# Patient Record
Sex: Male | Born: 1955 | Race: Black or African American | Hispanic: No | Marital: Single | Smoking: Never smoker
Health system: Southern US, Community
[De-identification: ages and names within clinical notes are randomized; demographics above are authoritative.]

## PROBLEM LIST (undated history)

## (undated) DIAGNOSIS — E119 Type 2 diabetes mellitus without complications: Secondary | ICD-10-CM

## (undated) DIAGNOSIS — I10 Essential (primary) hypertension: Secondary | ICD-10-CM

## (undated) DIAGNOSIS — N183 Chronic kidney disease, stage 3 unspecified: Secondary | ICD-10-CM

## (undated) DIAGNOSIS — E785 Hyperlipidemia, unspecified: Secondary | ICD-10-CM

## (undated) DIAGNOSIS — J189 Pneumonia, unspecified organism: Secondary | ICD-10-CM

## (undated) DIAGNOSIS — E669 Obesity, unspecified: Secondary | ICD-10-CM

## (undated) DIAGNOSIS — I509 Heart failure, unspecified: Secondary | ICD-10-CM

## (undated) HISTORY — PX: REPAIR OF LEFT VENTRICLE LACERATION: SHX6555

---

## 2004-12-16 ENCOUNTER — Ambulatory Visit: Payer: Self-pay | Admitting: Cardiovascular Disease

## 2005-09-22 ENCOUNTER — Ambulatory Visit: Payer: Self-pay | Admitting: Surgery

## 2010-11-11 ENCOUNTER — Ambulatory Visit: Payer: Self-pay | Admitting: Emergency Medicine

## 2010-11-14 LAB — PATHOLOGY REPORT

## 2013-11-19 ENCOUNTER — Ambulatory Visit: Payer: Self-pay | Admitting: Urology

## 2013-11-19 LAB — BASIC METABOLIC PANEL
Anion Gap: 6 — ABNORMAL LOW (ref 7–16)
BUN: 20 mg/dL — ABNORMAL HIGH (ref 7–18)
CALCIUM: 9.8 mg/dL (ref 8.5–10.1)
CHLORIDE: 99 mmol/L (ref 98–107)
CREATININE: 1.5 mg/dL — AB (ref 0.60–1.30)
Co2: 30 mmol/L (ref 21–32)
EGFR (African American): >60
EGFR (Non-African Amer.): 51 — ABNORMAL LOW
GLUCOSE: 211 mg/dL — AB (ref 65–99)
OSMOLALITY: 279 (ref 275–301)
POTASSIUM: 4.7 mmol/L (ref 3.5–5.1)
SODIUM: 135 mmol/L — AB (ref 136–145)

## 2013-11-24 ENCOUNTER — Ambulatory Visit: Payer: Self-pay | Admitting: Urology

## 2014-04-25 NOTE — Op Note (Signed)
PATIENT NAME:  [name], [name] MR#:  [id number] DATE OF BIRTH:  1955/01/23  DATE OF PROCEDURE:  11/24/2013  PREOPERATIVE DIAGNOSIS: Phimosis, penile condylomata.  POSTOPERATIVE DIAGNOSIS: Phimosis, penile condylomata   PROCEDURE PERFORMED: Circumcision.   ANESTHESIA: General anesthesia with mask LMA airway.   ATTENDING SURGEON: Claris GladdenAshley J. Ogle Hoeffner, MD.   ESTIMATED BLOOD LOSS: Minimal.  DRAINS: None.   COMPLICATIONS: None.   SPECIMENS: Foreskin.   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 He is now unable to retract his foreskin, and has fungating penile warts. He would like to undergo circumcision. For this purpose, he was counseled on his various treatment options, and has elected to undergo circumcision in the Operating Room as well as possible penile biopsy once the gland was exposed if any other lesions were identified. The risks and benefits of the procedure were explained in detail. The patient agreed to proceed as planned.   PROCEDURE IN DETAIL: The patient was correctly identified in the preoperative holding area and informed consent was confirmed. He was brought to the operating suite and placed on the table in the supine position. At this time, a universal timeout protocol was performed. All team members were identified. Venodyne boots were placed and he was administered 2 g of IV Ancef in the perioperative period. He was then placed under general anesthesia, repositioned and prepped and draped in standard surgical fashion.   At this point in time, local anesthesia was administered using 1% lidocaine 10 mL as a dorsal penile block, as well as 10 mL as a ring block around the base of the penile shaft. The phallus was examined, which revealed a large conglomeration of distal penile warts on the edge of the foreskin. The foreskin could not be retracted at this point in time. A clamp  was then used to crush the dorsal skin, and heavy scissors were used to create a dorsal slit in order to further retract the foreskin. Once this was performed, the glans was exposed. It was carefully inspected, and there was no evidence of glandular lesions. The foreskin was very mobile, and there was no concern for invasive malignancy related to the genital warts. Additional Betadine solution was used to clean the glans at this point. Two rings were drawn, 1 proximally 1 cm proximal to the coronal margin and 1 in the mid shaft area. A knife was used to incise these rings. The foreskin was then removed in a sleeve-like fashion. Care was taken to achieve very meticulous hemostasis. At this point, the penile shaft skin was then reapproximated using a series of interrupted 3-0 chromic sutures with a U-stitch at the frenulum. This created a nice cosmetic effect. He was then cleaned and dried, and a dressing of Vaseline gauze Conform and a Coban was applied.  The patient was then reversed from anesthesia and taken to the PACU in stable condition. There were no complications in this case.    ____________________________ Claris GladdenAshley J. Elija Mccamish, MD ajb:MT D: 11/24/2013 14:20:00 ET T: 11/24/2013 16:03:15 ET JOB#: [id number]  cc: Claris GladdenAshley J. Erland Vivas, MD, <Dictator> Claris GladdenASHLEY J Patricie Geeslin MD ELECTRONICALLY SIGNED 12/17/2013 10:49

## 2014-04-27 LAB — SURGICAL PATHOLOGY

## 2014-08-27 ENCOUNTER — Encounter: Payer: Self-pay | Admitting: Emergency Medicine

## 2014-08-27 ENCOUNTER — Emergency Department
Admission: EM | Admit: 2014-08-27 | Discharge: 2014-08-27 | Disposition: A | Discharge status: Home / Self Care | Payer: BLUE CROSS/BLUE SHIELD | Attending: Emergency Medicine | Admitting: Emergency Medicine

## 2014-08-27 DIAGNOSIS — S81811A Laceration without foreign body, right lower leg, initial encounter: Secondary | ICD-10-CM | POA: Diagnosis present

## 2014-08-27 DIAGNOSIS — W1843XA Slipping, tripping and stumbling without falling due to stepping from one level to another, initial encounter: Secondary | ICD-10-CM | POA: Diagnosis not present

## 2014-08-27 DIAGNOSIS — Y9389 Activity, other specified: Secondary | ICD-10-CM | POA: Diagnosis not present

## 2014-08-27 DIAGNOSIS — E119 Type 2 diabetes mellitus without complications: Secondary | ICD-10-CM | POA: Insufficient documentation

## 2014-08-27 DIAGNOSIS — Y9289 Other specified places as the place of occurrence of the external cause: Secondary | ICD-10-CM | POA: Insufficient documentation

## 2014-08-27 DIAGNOSIS — Y998 Other external cause status: Secondary | ICD-10-CM | POA: Diagnosis not present

## 2014-08-27 HISTORY — DX: Type 2 diabetes mellitus without complications: E11.9

## 2014-08-27 MED ORDER — LIDOCAINE-EPINEPHRINE (PF) 1 %-1:200000 IJ SOLN
5.0000 mL | Freq: Once | INTRAMUSCULAR | Status: AC
  Administered 2014-08-27: 5 mL
  Filled 2014-08-27: qty 30

## 2014-08-27 MED ORDER — HYDROCODONE-ACETAMINOPHEN 5-325 MG PO TABS: 1.0000 | ORAL_TABLET | ORAL | Status: DC | PRN

## 2014-08-27 NOTE — ED Notes (Signed)
Patient ambulatory to triage with steady gait, without difficulty or distress noted; pt reports cut right lower leg this morning while jumping on a box during training session

## 2014-08-27 NOTE — ED Provider Notes (Signed)
[facility] Emergency Department Provider Note  ____________________________________________  Time seen: Approximately 7:07 AM  I have reviewed the triage vital signs and the nursing notes.   HISTORY  Chief Complaint Laceration   HPI [name] is a 59 y.o. male is here with laceration to his right lower leg. He states that he was doing a training cross fit class this morning. He was jumping up on a box, missed and came down on his leg. He states he did not hit it hard enough to have fractured a bone but "it did roll the skin".He is up-to-date on his immunizations. He is diabetic and generally has good control of his glucose levels. He denies any head injury or loss of consciousness. 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 Currently he rates his pain 4 out of 10. Patient was ambulatory to the exam room without difficulty.   Past Medical History  Diagnosis Date  . Diabetes mellitus without complication     There are no active problems to display for this patient.   Past Surgical History  Procedure Laterality Date  . Repair of left ventricle laceration      Current Outpatient Rx  Name  Route  Sig  Dispense  Refill  . HYDROcodone-acetaminophen (NORCO/VICODIN) 5-325 MG per tablet   Oral   Take 1 tablet by mouth every 4 (four) hours as needed for moderate pain.   20 tablet   0     Allergies Review of patient's allergies indicates no known allergies.  No family history on file.  Social History Social History  Substance Use Topics  . Smoking status: Not on file  . Smokeless tobacco: Not on file  . Alcohol Use: Not on file    Review of Systems Constitutional: No fever/chills Cardiovascular: Denies chest pain. Respiratory: Denies shortness of breath. Gastrointestinal: No abdominal pain.  No nausea, no vomiting.  Musculoskeletal: Negative for back pain. Skin: Negative for rash. Neurological: Negative for  headaches, focal weakness or numbness.  10-point ROS otherwise negative.  ____________________________________________   PHYSICAL EXAM:  VITAL SIGNS: ED Triage Vitals  Enc Vitals Group     BP 08/27/14 0648 133/65 mmHg     Pulse Rate 08/27/14 0648 56     Resp 08/27/14 0648 18     Temp 08/27/14 0648 97.6 F (36.4 C)     Temp Source 08/27/14 0648 Oral     SpO2 08/27/14 0648 95 %     Weight 08/27/14 0648 255 lb (115.667 kg)     Height 08/27/14 0648  (1.702 m)     Head Cir --      Peak Flow --      Pain Score 08/27/14 0648 4     Pain Loc --      Pain Edu? --      Excl. in GC? --     Constitutional: Alert and oriented. Well appearing and in no acute distress. Eyes: Conjunctivae are normal. PERRL. EOMI. Head: Atraumatic. Nose: No congestion/rhinnorhea. Neck: No stridor.   Cardiovascular: Normal rate, regular rhythm. Grossly normal heart sounds.  Good peripheral circulation. Respiratory: Normal respiratory effort.  No retractions. Lungs CTAB. Gastrointestinal: Soft and nontender. No distention. Musculoskeletal: Right anterior lower leg with laceration. Bleeding under control. There is no tenderness on palpation of the knee or ankle. Range of motion is within normal limits. Neurologic:  Normal speech and language. No gross focal neurologic deficits are appreciated. No gait  instability. Skin:  Skin is warm, dry and intact. Laceration right lower leg anterior aspect. Psychiatric: Mood and affect are normal. Speech and behavior are normal.  ____________________________________________   LABS (all labs ordered are listed, but only abnormal results are displayed)  Labs Reviewed - No data to display   RADIOLOGY  Deferred ____________________________________________   PROCEDURES  Procedure(s) performed: LACERATION REPAIR Performed by: Tommi Rumps Authorized by: Tommi Rumps Consent: Verbal consent obtained. Risks and benefits: risks, benefits and  alternatives were discussed Consent given by: patient Patient identity confirmed: provided demographic data Prepped and Draped in normal sterile fashion Wound explored  Laceration Location: Right anterior leg  Laceration Length: 8.5 cm  No Foreign Bodies seen or palpated  Anesthesia: local infiltration  Local anesthetic: lidocaine 1 % with epinephrine  Anesthetic total: 8 ml  Irrigation method: syringe Amount of cleaning: standard  Skin closure: 4-0 vicryl and 4-0 Ethilon   Number of sutures: 4 subcutaneous and 17 external  sutures   Technique: Simple interrupted subcutaneous sutures with 4-0 Vicryl. There were 15 simple interrupted sutures with 4-0 Ethilon with 2 mattress sutures for a total of 17 externally.  Patient tolerance: Patient tolerated the procedure well with no immediate complications.   Critical Care performed: No  ____________________________________________   INITIAL IMPRESSION / ASSESSMENT AND PLAN / ED COURSE  Pertinent labs & imaging results that were available during my care of the patient were reviewed by me and considered in my medical decision making (see chart for details).  Patient was given a prescription for Norco as needed for pain. He was also given a note to remain out of work today and elevate as needed for swelling. Patient is to return to the emergency room if any signs of swelling or fever. He is to return to the emergency room for suture removal in 12-14 days. ____________________________________________   FINAL CLINICAL IMPRESSION(S) / ED DIAGNOSES  Final diagnoses:  Laceration of leg excluding thigh, with complication, right, initial encounter      Tommi Rumps, PA-C 08/27/14 1407  Jene Every, MD 08/27/14 [phone]

## 2014-08-27 NOTE — Discharge Instructions (Signed)
WOUND CARE Please return in 14 days to have your stitches/staples removed or sooner if you have concerns.  Keep area clean and dry for 24 hours. Do not remove bandage, if applied.  After 24 hours, remove bandage and wash wound gently with mild soap and warm water. Reapply a new bandage after cleaning wound, if directed.  Continue daily cleansing with soap and water until stitches/staples are removed.  Do not apply any ointments or creams to the wound while stitches/staples are in place, as this may cause delayed healing.  Notify the office if you experience any of the following signs of infection: Swelling, redness, pus drainage, streaking, fever >101.0 F  Notify the office if you experience excessive bleeding that does not stop after 15-20 minutes of constant, firm pressure.  

## 2014-09-10 ENCOUNTER — Emergency Department
Admission: EM | Admit: 2014-09-10 | Discharge: 2014-09-10 | Disposition: A | Discharge status: Home / Self Care | Payer: BLUE CROSS/BLUE SHIELD | Attending: Emergency Medicine | Admitting: Emergency Medicine

## 2014-09-10 DIAGNOSIS — E119 Type 2 diabetes mellitus without complications: Secondary | ICD-10-CM | POA: Diagnosis not present

## 2014-09-10 DIAGNOSIS — Z4802 Encounter for removal of sutures: Secondary | ICD-10-CM | POA: Diagnosis present

## 2014-09-10 NOTE — ED Notes (Signed)
Patient to triage for suture removal.

## 2014-09-10 NOTE — Discharge Instructions (Signed)

## 2014-09-10 NOTE — ED Provider Notes (Signed)
[facility] Emergency Department Provider Note  ____________________________________________  Time seen: Approximately 8:23 AM  I have reviewed the triage vital signs and the nursing notes.   HISTORY  Chief Complaint Suture / Staple Removal   HPI [name] is a 59 y.o. male is here for suture removal. Patient was seen on 8/25 for a laceration to his right lower leg. Patient states he has not had any problems with this. There is been no drainage and he is continue to exercise is normal.   Past Medical History  Diagnosis Date  . Diabetes mellitus without complication     There are no active problems to display for this patient.   Past Surgical History  Procedure Laterality Date  . Repair of left ventricle laceration      Current Outpatient Rx  Name  Route  Sig  Dispense  Refill  . HYDROcodone-acetaminophen (NORCO/VICODIN) 5-325 MG per tablet   Oral   Take 1 tablet by mouth every 4 (four) hours as needed for moderate pain.   20 tablet   0     Allergies Review of patient's allergies indicates no known allergies.  No family history on file.  Social History Social History  Substance Use Topics  . Smoking status: Not on file  . Smokeless tobacco: Not on file  . Alcohol Use: Not on file    Review of Systems Constitutional: No fever/chills Eyes: No visual changes. 10-point ROS otherwise negative.  ____________________________________________   PHYSICAL EXAM:  VITAL SIGNS: ED Triage Vitals  Enc Vitals Group     BP 09/10/14 0758 139/72 mmHg     Pulse Rate 09/10/14 0758 60     Resp 09/10/14 0758 18     Temp 09/10/14 0758 97.7 F (36.5 C)     Temp Source 09/10/14 0758 Oral     SpO2 09/10/14 0758 98 %     Weight 09/10/14 0758 260 lb (117.935 kg)     Height 09/10/14 0758 5\' 7"  (1.702 m)     Head Cir --      Peak Flow --      Pain Score 09/10/14 0804 6     Pain Loc --      Pain Edu? --      Excl. in GC? --      Constitutional: Alert and oriented. Well appearing and in no acute distress. Eyes: Conjunctivae are normal. PERRL. EOMI. Head: Atraumatic. Nose: No congestion/rhinnorhea. Neck: No stridor.   Respiratory: Normal respiratory effort.  No retractions.  Gastrointestinal: No distention.   Musculoskeletal: Normal gait Neurologic:  Normal speech and language. No gross focal neurologic deficits are appreciated. No gait instability. Skin:  Skin is warm.  Sutured area is warm and dry. There is no evidence of infection. Sutures were removed by the RN. Psychiatric: Mood and affect are normal. Speech and behavior are normal.  ____________________________________________   LABS (all labs ordered are listed, but only abnormal results are displayed)  Labs Reviewed - No data to display  PROCEDURES  Procedure(s) performed: None  Critical Care performed: No  ____________________________________________   INITIAL IMPRESSION / ASSESSMENT AND PLAN / ED COURSE  Pertinent labs & imaging results that were available during my care of the patient were reviewed by me and considered in my medical decision making (see chart for details).  Patient tolerated procedure well. He is return if any continued problems. ____________________________________________   FINAL CLINICAL IMPRESSION(S) / ED DIAGNOSES  Final diagnoses:  Encounter for removal of  sutures      Tommi Rumps, PA-C 09/10/14 1024  Arnaldo Natal, MD 09/10/14 [phone]

## 2014-09-24 ENCOUNTER — Encounter: Payer: Self-pay | Admitting: Emergency Medicine

## 2014-09-24 ENCOUNTER — Emergency Department
Admission: EM | Admit: 2014-09-24 | Discharge: 2014-09-24 | Disposition: A | Discharge status: Home / Self Care | Payer: BLUE CROSS/BLUE SHIELD | Attending: Emergency Medicine | Admitting: Emergency Medicine

## 2014-09-24 DIAGNOSIS — Z4801 Encounter for change or removal of surgical wound dressing: Secondary | ICD-10-CM | POA: Diagnosis present

## 2014-09-24 DIAGNOSIS — Z5189 Encounter for other specified aftercare: Secondary | ICD-10-CM

## 2014-09-24 MED ORDER — CEPHALEXIN 500 MG PO CAPS: 500.0000 mg | ORAL_CAPSULE | Freq: Four times a day (QID) | ORAL | Status: DC

## 2014-09-24 NOTE — ED Notes (Signed)
Here for wound check  Had sutures placed to right lower leg  About 2 weeks ago

## 2014-09-24 NOTE — ED Notes (Signed)
Pt sts he was here 1 month ago for lac to R leg.  Pt sts stiches were removed 2 weeks ago.  Pt reports swelling and drainage from wound.  Pt sts he is concerned since he is a diabetic.

## 2014-09-24 NOTE — Discharge Instructions (Signed)
Sutured Wound Care Sutures are stitches that can be used to close wounds. Wound care helps prevent pain and infection.  HOME CARE INSTRUCTIONS   Rest and elevate the injured area until all the pain and swelling are gone.  Only take over-the-counter or prescription medicines for pain, discomfort, or fever as directed by your caregiver.  After 48 hours, gently wash the area with mild soap and water once a day, or as directed. Rinse off the soap. Pat the area dry with a clean towel. Do not rub the wound. This may cause bleeding.  Follow your caregiver's instructions for how often to change the bandage (dressing). Stop using a dressing after 2 days or after the wound stops draining.  If the dressing sticks, moisten it with soapy water and gently remove it.  Apply ointment on the wound as directed.  Avoid stretching a sutured wound.  Drink enough fluids to keep your urine clear or pale yellow.  Follow up with your caregiver for suture removal as directed.  Use sunscreen on your wound for the next 3 to 6 months so the scar will not darken. SEEK IMMEDIATE MEDICAL CARE IF:   Your wound becomes red, swollen, hot, or tender.  You have increasing pain in the wound.  You have a red streak that extends from the wound.  There is pus coming from the wound.  You have a fever.  You have shaking chills.  There is a bad smell coming from the wound.  You have persistent bleeding from the wound. MAKE SURE YOU:   Understand these instructions.  Will watch your condition.  Will get help right away if you are not doing well or get worse. Document Released: 01/27/2004 Document Revised: 03/13/2011 Document Reviewed: 04/24/2010 [facility] Patient Information 2015 [place], [place]. This information is not intended to replace advice given to you by your health care provider. Make sure you discuss any questions you have with your health care provider.  Wound Care Wound care helps prevent pain and  infection.  You may need a tetanus shot if:  You cannot remember when you had your last tetanus shot.  You have never had a tetanus shot.  The injury broke your skin. If you need a tetanus shot and you choose not to have one, you may get tetanus. Sickness from tetanus can be serious. HOME CARE   Only take medicine as told by your doctor.  Clean the wound daily with mild soap and water.  Change any bandages (dressings) as told by your doctor.  Put medicated cream and a bandage on the wound as told by your doctor.  Change the bandage if it gets wet, dirty, or starts to smell.  Take showers. Do not take baths, swim, or do anything that puts your wound under water.  Rest and raise (elevate) the wound until the pain and puffiness (swelling) are better.  Keep all doctor visits as told. GET HELP RIGHT AWAY IF:   Yellowish-white fluid (pus) comes from the wound.  Medicine does not lessen your pain.  There is a red streak going away from the wound.  You have a fever. MAKE SURE YOU:   Understand these instructions.  Will watch your condition.  Will get help right away if you are not doing well or get worse. Document Released: 09/28/2007 Document Revised: 03/13/2011 Document Reviewed: 04/24/2010 [facility] Patient Information 2015 [place], [place]. This information is not intended to replace advice given to you by your health care provider. Make sure you  discuss any questions you have with your health care provider.  Take the antibiotic as directed.  Cleanse and dress the wound daily. Rest with the leg elevated when seated.

## 2014-09-24 NOTE — ED Provider Notes (Signed)
[facility] Emergency Department Provider Note ____________________________________________  Time seen: 1205  I have reviewed the triage vital signs and the nursing notes.  HISTORY  Chief Complaint  Wound Check  HPI  [name] is a 59 y.o. male presents for wound check.  The wound is well-approximated with signs of local erythema, and lower extremity edema. The injury initially required subcutaneous and dermal sutures. He admits to not keeping the wound clean and covered.  He has not cleansed the wound or applied any antibiotic ointment to it.     Review of Systems  Constitutional: Negative for fever. HEENT:  Normocephalic/atraumatic. Negative for visual/hearing changes, sore throat, or nasal congestion. Cardiovascular: Negative for chest pain. Respiratory: Negative for shortness of breath. Musculoskeletal: Negative for back pain. Skin: RLE laceration s/p suture repair Neurological: Negative for headaches, focal weakness or numbness. Hematological/Lymphatic:Negative for enlarged lymph nodes  EXAM  Constitutional: Alert and oriented. Well appearing and in no distress. HENT: Normocephalic and atraumatic. Conjunctivae are normal. PERRL. Normal extraocular movements. Hematological/Lymphatic/Immunological: No cervical lymphadenopathy. Cardiovascular: Normal rate, regular rhythm. Normal distal pulses.  Respiratory: Normal respiratory effort. No wheezes/rales/rhonchi. Gastrointestinal: Soft and nontender. No distention. Musculoskeletal: Nontender with normal range of motion in all extremities.  Neurologic:  Normal gait without ataxia. Normal speech and language. No gross focal neurologic deficits are appreciated. Skin:  Skin is warm, dry and intact. No rash noted. RLE with local edema due to contusion/laceration. Wound healing with local serous scabbing noted. Minimal expression of purulent discharge laterally.  Psychiatric: Mood and affect are normal.  Patient exhibits appropriate insight and judgment. _________________________________________________________ INITIAL IMPRESSION / ASSESSMENT AND PLAN / ED COURSE  Wound check status post right lower extremity deep contusion, and laceration. Patient given instructions on wound care post suture removal. He is also provided with a prescription for Keflex for local wound cellulitis. He is encouraged to cleanse the wound daily, and apply antibiotic ointment as well as a dressing for continued improvement. He is to rest with the leg elevated to help reduce traumatic swelling. He is out of his primary care provider for ongoing symptoms or return to the ED for wound check as needed.   FINAL CLINICAL IMPRESSION(S) / ED DIAGNOSES  Final diagnoses:  Visit for wound check    Lissa Hoard, PA-C 09/27/14 0045  Minna Antis, MD 09/30/14 1501

## 2014-10-07 ENCOUNTER — Encounter: Payer: BLUE CROSS/BLUE SHIELD | Attending: Surgery | Admitting: Surgery

## 2014-10-07 DIAGNOSIS — I129 Hypertensive chronic kidney disease with stage 1 through stage 4 chronic kidney disease, or unspecified chronic kidney disease: Secondary | ICD-10-CM | POA: Diagnosis not present

## 2014-10-07 DIAGNOSIS — S81811A Laceration without foreign body, right lower leg, initial encounter: Secondary | ICD-10-CM | POA: Diagnosis not present

## 2014-10-07 DIAGNOSIS — N189 Chronic kidney disease, unspecified: Secondary | ICD-10-CM | POA: Insufficient documentation

## 2014-10-07 DIAGNOSIS — Z954 Presence of other heart-valve replacement: Secondary | ICD-10-CM | POA: Insufficient documentation

## 2014-10-07 DIAGNOSIS — I5020 Unspecified systolic (congestive) heart failure: Secondary | ICD-10-CM | POA: Insufficient documentation

## 2014-10-07 DIAGNOSIS — E11622 Type 2 diabetes mellitus with other skin ulcer: Secondary | ICD-10-CM | POA: Diagnosis not present

## 2014-10-07 DIAGNOSIS — L97212 Non-pressure chronic ulcer of right calf with fat layer exposed: Secondary | ICD-10-CM | POA: Insufficient documentation

## 2014-10-07 DIAGNOSIS — X58XXXA Exposure to other specified factors, initial encounter: Secondary | ICD-10-CM | POA: Insufficient documentation

## 2014-10-07 DIAGNOSIS — Z794 Long term (current) use of insulin: Secondary | ICD-10-CM | POA: Insufficient documentation

## 2014-10-07 NOTE — Progress Notes (Addendum)
[name] ([id number]) Visit Report for 10/07/2014 Allergy List Details Patient Name: [name], [name] Date of Service: 10/07/2014 10:15 AM Medical Record Number: [id number] Patient Account Number: [id number] Date of Birth/Sex: Apr 03, 1955 (59 y.o. Male) Treating RN: Clover Mealy, RN, BSN, [place] Sink Primary Care Physician: Bluford Main Other Clinician: Referring Physician: Treating Physician/Extender: BURNS III, WALTER Weeks in Treatment: 0 Allergies Active Allergies no known allergies Allergy Notes Electronic Signature(s) Signed: 10/07/2014 7:33:41 AM By: Elpidio Eric BSN, RN Entered By: Elpidio Eric on 10/07/2014 10:33:41 [name] ([id number]) -------------------------------------------------------------------------------- Arrival Information Details Patient Name: [name] Date of Service: 10/07/2014 10:15 AM Medical Record Number: [id number] Patient Account Number: [id number] Date of Birth/Sex: 12-22-1955 (59 y.o. Male) Treating RN: Clover Mealy, RN, BSN, [place] Sink Primary Care Physician: Bluford Main Other Clinician: Referring Physician: Treating Physician/Extender: BURNS III, Regis Bill in Treatment: 0 Visit Information Patient Arrived: Ambulatory Arrival Time: 10:24 Accompanied By: self Transfer Assistance: None Patient Identification Verified: Yes Secondary Verification Process Yes Completed: Patient Requires Transmission-Based No Precautions: Patient Has Alerts: No Electronic Signature(s) Signed: 10/07/2014 7:26:47 AM By: Elpidio Eric BSN, RN Entered By: Elpidio Eric on 10/07/2014 10:26:47 [name] ([id number]) -------------------------------------------------------------------------------- Clinic Level of Care Assessment Details Patient Name: [name] Date of Service: 10/07/2014 10:15 AM Medical Record Number: [id number] Patient Account Number: [id number] Date of Birth/Sex: [birth year] (59 y.o. Male) Treating RN: Clover Mealy, RN, BSN, [place] Sink Primary Care  Physician: Bluford Main Other Clinician: Referring Physician: Treating Physician/Extender: BURNS III, Regis Bill in Treatment: 0 Clinic Level of Care Assessment Items TOOL 1 Quantity Score  - Use when EandM and Procedure is performed on INITIAL visit 0 ASSESSMENTS - Nursing Assessment / Reassessment X - General Physical Exam (combine w/ comprehensive assessment (listed just 1 20 below) when performed on new pt. evals) X - Comprehensive Assessment (HX, ROS, Risk Assessments, Wounds Hx, etc.) 1 25 ASSESSMENTS - Wound and Skin Assessment / Reassessment  - Dermatologic / Skin Assessment (not related to wound area) 0 ASSESSMENTS - Ostomy and/or Continence Assessment and Care  - Incontinence Assessment and Management 0  - Ostomy Care Assessment and Management (repouching, etc.) 0 PROCESS - Coordination of Care X - Simple Patient / Family Education for ongoing care 1 15  - Complex (extensive) Patient / Family Education for ongoing care 0 X - Staff obtains Chiropractor, Records, Test Results / Process Orders 1 10  - Staff telephones HHA, Nursing Homes / Clarify orders / etc 0  - Routine Transfer to another Facility (non-emergent condition) 0  - Routine Hospital Admission (non-emergent condition) 0 X - New Admissions / Manufacturing engineer / Ordering NPWT, Apligraf, etc. 1 15  - Emergency Hospital Admission (emergent condition) 0 PROCESS - Special Needs  - Pediatric / Minor Patient Management 0  - Isolation Patient Management 0 Kutch, Jj L. ([id number])  - Hearing / Language / Visual special needs 0  - Assessment of Community assistance (transportation, D/C planning, etc.) 0  - Additional assistance / Altered mentation 0  - Support Surface(s) Assessment (bed, cushion, seat, etc.) 0 INTERVENTIONS - Miscellaneous  - External ear exam 0  - Patient Transfer (multiple staff / Nurse, adult / Similar devices) 0  - Simple Staple / Suture removal (25 or  less) 0  - Complex Staple / Suture removal (26 or more) 0  - Hypo/Hyperglycemic Management (do not check if billed separately) 0 X - Ankle / Brachial Index (ABI) - do not check if billed separately 1 15 Has the patient been seen at the hospital  within the last three years: Yes Total Score: 100 Level Of Care: New/Established - Level 3 Electronic Signature(s) Signed: 10/07/2014 8:15:41 AM By: Elpidio Eric BSN, RN Entered By: Elpidio Eric on 10/07/2014 11:15:40 [name] ([id number]) -------------------------------------------------------------------------------- Encounter Discharge Information Details Patient Name: [name] Date of Service: 10/07/2014 10:15 AM Medical Record Number: [id number] Patient Account Number: [id number] Date of Birth/Sex: 04/25/1955 (59 y.o. Male) Treating RN: Clover Mealy, RN, BSN, [place] Sink Primary Care Physician: Bluford Main Other Clinician: Referring Physician: Treating Physician/Extender: BURNS III, Regis Bill in Treatment: 0 Encounter Discharge Information Items Discharge Pain Level: 0 Discharge Condition: Stable Ambulatory Status: Ambulatory Discharge Destination: Home Transportation: Private Auto Accompanied By: self Schedule Follow-up Appointment: No Medication Reconciliation completed and provided to Patient/Care No Camauri Craton: Provided on Clinical Summary of Care: 10/07/2014 Form Type Recipient Paper Patient LL Electronic Signature(s) Signed: 10/07/2014 8:16:49 AM By: Elpidio Eric BSN, RN Previous Signature: 10/07/2014 8:15:31 AM Version By: Gwenlyn Perking Entered By: Elpidio Eric on 10/07/2014 11:16:49 Petrasek, Loleta Chance ([id number]) -------------------------------------------------------------------------------- Lower Extremity Assessment Details Patient Name: [name] Date of Service: 10/07/2014 10:15 AM Medical Record Number: [id number] Patient Account Number: [id number] Date of Birth/Sex: Jun 10, 1955 (59 y.o. Male) Treating RN:  Clover Mealy, RN, BSN, [place] Primary Care Physician: Bluford Main Other Clinician: Referring Physician: Treating Physician/Extender: BURNS III, Regis Bill in Treatment: 0 Edema Assessment Assessed: [Left: No] [Right: No] E[Left: dema] [Right: :] Calf Left: Right: Point of Measurement: 34 cm From Medial Instep 42 cm 43.5 cm Ankle Left: Right: Point of Measurement: 8 cm From Medial Instep 24 cm 26 cm Vascular Assessment Claudication: Claudication Assessment [Left:None] [Right:None] Pulses: Posterior Tibial Palpable: [Left:Yes] [Right:Yes] Doppler: [Left:Monophasic] [Right:Monophasic] Dorsalis Pedis Palpable: [Left:Yes] [Right:Yes] Doppler: [Left:Monophasic] [Right:Monophasic] Extremity colors, hair growth, and conditions: Extremity Color: [Left:Dusky] [Right:Dusky] Hair Growth on Extremity: [Left:Yes] [Right:Yes] Temperature of Extremity: [Left:Warm] [Right:Warm] Capillary Refill: [Left:< 3 seconds] [Right:< 3 seconds] Blood Pressure: Brachial: [Left:125] Dorsalis Pedis: 145 [Left:Dorsalis Pedis: 145] Ankle: Posterior Tibial: 155 [Left:Posterior Tibial: 150 1.24] [Right:1.20] Toe Nail Assessment Left: Right: Thick: Yes Yes Discolored: Yes Yes Stemmer, Kaenan L. ([id number]) Deformed: No No Improper Length and Hygiene: No No Electronic Signature(s) Signed: 10/07/2014 7:51:30 AM By: Elpidio Eric BSN, RN Previous Signature: 10/07/2014 7:51:12 AM Version By: Elpidio Eric BSN, RN Entered By: Elpidio Eric on 10/07/2014 10:51:29 Patch, Loleta Chance ([id number]) -------------------------------------------------------------------------------- Multi Wound Chart Details Patient Name: [name] Date of Service: 10/07/2014 10:15 AM Medical Record Number: [id number] Patient Account Number: [id number] Date of Birth/Sex: 1955/12/02 (59 y.o. Male) Treating RN: Clover Mealy, RN, BSN, [place] Primary Care Physician: Bluford Main Other Clinician: Referring Physician: Treating Physician/Extender:  BURNS III, WALTER Weeks in Treatment: 0 Vital Signs Height(in): 67 Pulse(bpm): 54 Weight(lbs): 256 Blood Pressure 125/69 (mmHg): Body Mass Index(BMI): 40 Temperature(F): 98.1 Respiratory Rate 18 (breaths/min): Photos: [1:No Photos] [N/A:N/A] Wound Location: [1:Right, Anterior Lower Leg] [N/A:N/A] Wounding Event: [1:Trauma] [N/A:N/A] Primary Etiology: [1:Trauma, Other] [N/A:N/A] Date Acquired: [1:09/08/2014] [N/A:N/A] Weeks of Treatment: [1:0] [N/A:N/A] Wound Status: [1:Open] [N/A:N/A] Measurements L x W x D 0.5x5.5x0.2 [N/A:N/A] (cm) Area (cm) : [1:2.16] [N/A:N/A] Volume (cm) : [1:0.432] [N/A:N/A] Debridement: [1:Debridement ([id number])] [N/A:N/A] Time-Out Taken: [1:Yes] [N/A:N/A] Pain Control: [1:Lidocaine 4% Topical Solution] [N/A:N/A] Tissue Debrided: [1:Necrotic/Eschar, Fibrin/Slough, Exudates, Subcutaneous] [N/A:N/A] Level: [1:Skin/Subcutaneous Tissue] [N/A:N/A] Debridement Area (sq [1:2.75] [N/A:N/A] cm): Instrument: [1:Curette] [N/A:N/A] Bleeding: [1:Minimum] [N/A:N/A] Hemostasis Achieved: Pressure [N/A:N/A] Procedural Pain: [1:0] [N/A:N/A] Post Procedural Pain: 0 [N/A:N/A] Debridement Treatment Procedure was tolerated [N/A:N/A] Response: [1:well] Post Debridement 0.5x5.5x0.2 N/A N/A Measurements L x W x D (  cm) Post Debridement 0.432 N/A N/A Volume: (cm) Periwound Skin Texture: No Abnormalities Noted N/A N/A Periwound Skin No Abnormalities Noted N/A N/A Moisture: Periwound Skin Color: No Abnormalities Noted N/A N/A Tenderness on No N/A N/A Palpation: Procedures Performed: Debridement N/A N/A Treatment Notes Electronic Signature(s) Signed: 10/07/2014 8:04:04 AM By: Elpidio Eric BSN, RN Entered By: Elpidio Eric on 10/07/2014 11:04:04 Cordner, Loleta Chance ([id number]) -------------------------------------------------------------------------------- Multi-Disciplinary Care Plan Details Patient Name: [name] Date of Service: 10/07/2014 10:15  AM Medical Record Number: [id number] Patient Account Number: [id number] Date of Birth/Sex: 06/23/1955 (59 y.o. Male) Treating RN: Clover Mealy, RN, BSN, [place] Sink Primary Care Physician: Bluford Main Other Clinician: Referring Physician: Treating Physician/Extender: BURNS III, Regis Bill in Treatment: 0 Active Inactive Orientation to the Wound Care Program Nursing Diagnoses: Knowledge deficit related to the wound healing center program Goals: Patient/caregiver will verbalize understanding of the Wound Healing Center Program Date Initiated: 10/07/2014 Goal Status: Active Interventions: Provide education on orientation to the wound center Notes: Wound/Skin Impairment Nursing Diagnoses: Impaired tissue integrity Knowledge deficit related to ulceration/compromised skin integrity Goals: Patient/caregiver will verbalize understanding of skin care regimen Date Initiated: 10/07/2014 Goal Status: Active Ulcer/skin breakdown will have a volume reduction of 30% by week 4 Date Initiated: 10/07/2014 Goal Status: Active Ulcer/skin breakdown will have a volume reduction of 50% by week 8 Date Initiated: 10/07/2014 Goal Status: Active Ulcer/skin breakdown will have a volume reduction of 80% by week 12 Date Initiated: 10/07/2014 Goal Status: Active Ulcer/skin breakdown will heal within 14 weeks Date Initiated: 10/07/2014 [name] ([id number]) Goal Status: Active Interventions: Assess patient/caregiver ability to obtain necessary supplies Assess patient/caregiver ability to perform ulcer/skin care regimen upon admission and as needed Assess ulceration(s) every visit Provide education on ulcer and skin care Treatment Activities: Referred to DME Rondel Episcopo for dressing supplies : 10/07/2014 Skin care regimen initiated : 10/07/2014 Topical wound management initiated : 10/07/2014 Notes: Electronic Signature(s) Signed: 10/07/2014 11:03:38 AM By: Elpidio Eric BSN, RN Previous Signature: 10/07/2014  11:03:00 AM Version By: Elpidio Eric BSN, RN Entered By: Elpidio Eric on 10/07/2014 11:03:37 [name] ([id number]) -------------------------------------------------------------------------------- Pain Assessment Details Patient Name: [name] Date of Service: 10/07/2014 10:15 AM Medical Record Number: [id number] Patient Account Number: [id number] Date of Birth/Sex: [birth year] (59 y.o. Male) Treating RN: Clover Mealy, RN, BSN, [place] Sink Primary Care Physician: Bluford Main Other Clinician: Referring Physician: Treating Physician/Extender: BURNS III, WALTER Weeks in Treatment: 0 Active Problems Location of Pain Severity and Description of Pain Patient Has Paino No Site Locations Pain Management and Medication Current Pain Management: Electronic Signature(s) Signed: 10/07/2014 10:26:53 AM By: Elpidio Eric BSN, RN Entered By: Elpidio Eric on 10/07/2014 10:26:53 [name] ([id number]) -------------------------------------------------------------------------------- Patient/Caregiver Education Details Patient Name: [name] Date of Service: 10/07/2014 10:15 AM Medical Record Number: [id number] Patient Account Number: [id number] Date of Birth/Gender: [birth year] (59 y.o. Male) Treating RN: Clover Mealy, RN, BSN, [place] Sink Primary Care Physician: Bluford Main Other Clinician: Referring Physician: Treating Physician/Extender: BURNS III, Regis Bill in Treatment: 0 Education Assessment Education Provided To: Patient Education Topics Provided Welcome To The Wound Care Center: Methods: Explain/Verbal Responses: State content correctly Wound/Skin Impairment: Methods: Explain/Verbal Responses: State content correctly Electronic Signature(s) Signed: 10/07/2014 11:17:02 AM By: Elpidio Eric BSN, RN Entered By: Elpidio Eric on 10/07/2014 11:17:01 [name] ([id number]) -------------------------------------------------------------------------------- Wound Assessment Details Patient  Name: [name] Date of Service: 10/07/2014 10:15 AM Medical Record Number: [id number] Patient Account Number: [id number] Date of Birth/Sex: December 24, 1955 (59 y.o. Male) Treating RN: Clover Mealy, RN, BSN, [employer]  Primary Care Physician: Bluford Main Other Clinician: Referring Physician: Treating Physician/Extender: BURNS III, Regis Bill in Treatment: 0 Wound Status Wound Number: 1 Primary Etiology: Trauma, Other Wound Location: Right, Anterior Lower Leg Wound Status: Open Wounding Event: Trauma Date Acquired: 09/08/2014 Weeks Of Treatment: 0 Clustered Wound: No Photos Photo Uploaded By: Elpidio Eric on 10/07/2014 16:01:28 Wound Measurements Length: (cm) 0.5 % Reduction Width: (cm) 5.5 % Reduction Depth: (cm) 0.2 Area: (cm) 2.16 Volume: (cm) 0.432 in Area: in Volume: Periwound Skin Texture Texture Color No Abnormalities Noted: No No Abnormalities Noted: No Moisture No Abnormalities Noted: No Treatment Notes Wound #1 (Right, Anterior Lower Leg) 1. Cleansed with: Clean wound with Normal Saline 3. Peri-wound Care: PHUOC, HUY ([id number]) Skin Prep 4. Dressing Applied: Prisma Ag 5. Secondary Dressing Applied Bordered Foam Dressing 7. Secured with International aid/development worker) Signed: 10/07/2014 4:05:44 PM By: Elpidio Eric BSN, RN Entered By: Elpidio Eric on 10/07/2014 10:32:51 [name] ([id number]) -------------------------------------------------------------------------------- Vitals Details Patient Name: [name] Date of Service: 10/07/2014 10:15 AM Medical Record Number: [id number] Patient Account Number: [id number] Date of Birth/Sex: 1955-10-25 (59 y.o. Male) Treating RN: Clover Mealy, RN, BSN, Rita Primary Care Physician: Bluford Main Other Clinician: Referring Physician: Treating Physician/Extender: BURNS III, WALTER Weeks in Treatment: 0 Vital Signs Time Taken: 10:26 Temperature (F): 98.1 Height (in): 67 Pulse (bpm): 54 Source:  Stated Respiratory Rate (breaths/min): 18 Weight (lbs): 256 Blood Pressure (mmHg): 125/69 Source: Measured Reference Range: 80 - 120 mg / dl Body Mass Index (BMI): 40.1 Electronic Signature(s) Signed: 10/07/2014 10:27:41 AM By: Elpidio Eric BSN, RN Entered By: Elpidio Eric on 10/07/2014 10:27:41

## 2014-10-07 NOTE — Progress Notes (Signed)
EDEM, TIEGS ([id number]) Visit Report for 10/07/2014 Abuse/Suicide Risk Screen Details Patient Name: [name], [name] Date of Service: 10/07/2014 10:15 AM Medical Record Number: [id number] Patient Account Number: [id number] Date of Birth/Sex: Jan 31, 1955 (59 y.o. Male) Treating RN: Clover Mealy, RN, BSN, [place] Sink Primary Care Physician: Bluford Main Other Clinician: Referring Physician: Treating Physician/Extender: BURNS III, WALTER Weeks in Treatment: 0 Abuse/Suicide Risk Screen Items Answer ABUSE/SUICIDE RISK SCREEN: Has anyone close to you tried to hurt or harm you recentlyo No Do you feel uncomfortable with anyone in your familyo No Has anyone forced you do things that you didnot want to doo No Do you have any thoughts of harming yourselfo No Patient displays signs or symptoms of abuse and/or neglect. No Electronic Signature(s) Signed: 10/07/2014 7:31:32 AM By: Elpidio Eric BSN, RN Entered By: Elpidio Eric on 10/07/2014 10:31:31 [name] ([id number]) -------------------------------------------------------------------------------- Activities of Daily Living Details Patient Name: [name] Date of Service: 10/07/2014 10:15 AM Medical Record Number: [id number] Patient Account Number: [id number] Date of Birth/Sex: 08-19-1955 (59 y.o. Male) Treating RN: Clover Mealy, RN, BSN, [place] Sink Primary Care Physician: Bluford Main Other Clinician: Referring Physician: Treating Physician/Extender: BURNS III, Regis Bill in Treatment: 0 Activities of Daily Living Items Answer Activities of Daily Living (Please select one for each item) Drive Automobile Completely Able Take Medications Completely Able Use Telephone Completely Able Care for Appearance Completely Able Use Toilet Completely Able Bath / Shower Completely Able Dress Self Completely Able Feed Self Completely Able Walk Completely Able Get In / Out Bed Completely Able Housework Completely Able Prepare Meals Completely  Able Handle Money Completely Able Shop for Self Completely Able Electronic Signature(s) Signed: 10/07/2014 7:31:20 AM By: Elpidio Eric BSN, RN Entered By: Elpidio Eric on 10/07/2014 10:31:20 [name] ([id number]) -------------------------------------------------------------------------------- Education Assessment Details Patient Name: [name] Date of Service: 10/07/2014 10:15 AM Medical Record Number: [id number] Patient Account Number: [id number] Date of Birth/Sex: 11/14/1955 (59 y.o. Male) Treating RN: Clover Mealy, RN, BSN, [place] Sink Primary Care Physician: Bluford Main Other Clinician: Referring Physician: Treating Physician/Extender: BURNS III, Regis Bill in Treatment: 0 Primary Learner Assessed: Patient Learning Preferences/Education Level/Primary Language Learning Preference: Explanation Highest Education Level: High School Preferred Language: English Cognitive Barrier Assessment/Beliefs Language Barrier: No Physical Barrier Assessment Impaired Vision: Yes Glasses Impaired Hearing: No Decreased Hand dexterity: No Knowledge/Comprehension Assessment Knowledge Level: High Comprehension Level: High Ability to understand written High instructions: Ability to understand verbal High instructions: Motivation Assessment Anxiety Level: Calm Cooperation: Cooperative Education Importance: Acknowledges Need Interest in Health Problems: Asks Questions Perception: Coherent Willingness to Engage in Self- High Management Activities: Readiness to Engage in Self- High Management Activities: Electronic Signature(s) Signed: 10/07/2014 10:30:58 AM By: Elpidio Eric BSN, RN Entered By: Elpidio Eric on 10/07/2014 10:30:58 [name] ([id number]) -------------------------------------------------------------------------------- Fall Risk Assessment Details Patient Name: [name] Date of Service: 10/07/2014 10:15 AM Medical Record Number: [id number] Patient Account  Number: [id number] Date of Birth/Sex: October 27, 1955 (59 y.o. Male) Treating RN: Clover Mealy, RN, BSN, [place] Sink Primary Care Physician: Bluford Main Other Clinician: Referring Physician: Treating Physician/Extender: BURNS III, Regis Bill in Treatment: 0 Fall Risk Assessment Items FALL RISK ASSESSMENT: History of falling - immediate or within 3 months 25 Yes Secondary diagnosis 15 Yes Ambulatory aid None/bed rest/wheelchair/nurse 0 Yes Crutches/cane/walker 0 No Furniture 0 No IV Access/Saline Lock 0 No Gait/Training Normal/bed rest/immobile 0 Yes Weak 0 No Impaired 0 No Mental Status Oriented to own ability 0 Yes Electronic Signature(s) Signed: 10/07/2014 10:29:46 AM By: Elpidio Eric BSN, RN Entered ByClover Mealy,  Rita on 10/07/2014 10:29:46 Bramer, Loleta Chance ([id number]) -------------------------------------------------------------------------------- Foot Assessment Details Patient Name: [name], [name] Date of Service: 10/07/2014 10:15 AM Medical Record Number: [id number] Patient Account Number: [id number] Date of Birth/Sex: 1955/07/07 (59 y.o. Male) Treating RN: Clover Mealy, RN, BSN, Rita Primary Care Physician: Bluford Main Other Clinician: Referring Physician: Treating Physician/Extender: BURNS III, Regis Bill in Treatment: 0 Foot Assessment Items Site Locations + = Sensation present, - = Sensation absent, C = Callus, U = Ulcer R = Redness, W = Warmth, M = Maceration, PU = Pre-ulcerative lesion F = Fissure, S = Swelling, D = Dryness Assessment Right: Left: Other Deformity: No No Prior Foot Ulcer: No No Prior Amputation: No No Charcot Joint: No No Ambulatory Status: Ambulatory Without Help Gait: Steady Electronic Signature(s) Signed: 10/07/2014 10:27:56 AM By: Elpidio Eric BSN, RN Entered By: Elpidio Eric on 10/07/2014 10:27:56 Messerschmidt, Loleta Chance ([id number]) -------------------------------------------------------------------------------- Nutrition Risk Assessment Details Patient  Name: [name] Date of Service: 10/07/2014 10:15 AM Medical Record Number: [id number] Patient Account Number: [id number] Date of Birth/Sex: 1955/03/26 (59 y.o. Male) Treating RN: Clover Mealy, RN, BSN, Rita Primary Care Physician: Bluford Main Other Clinician: Referring Physician: Treating Physician/Extender: BURNS III, WALTER Weeks in Treatment: 0 Height (in): 67 Weight (lbs): 256 Body Mass Index (BMI): 40.1 Nutrition Risk Assessment Items NUTRITION RISK SCREEN: I have an illness or condition that made me change the kind and/or 0 No amount of food I eat I eat fewer than two meals per day 0 No I eat few fruits and vegetables, or milk products 0 No I have three or more drinks of beer, liquor or wine almost every day 0 No I have tooth or mouth problems that make it hard for me to eat 0 No I don't always have enough money to buy the food I need 0 No I eat alone most of the time 0 No I take three or more different prescribed or over-the-counter drugs a 0 No day Without wanting to, I have lost or gained 10 pounds in the last six 0 No months I am not always physically able to shop, cook and/or feed myself 0 No Nutrition Protocols Good Risk Protocol 0 No interventions needed Moderate Risk Protocol Electronic Signature(s) Signed: 10/07/2014 7:29:33 AM By: Elpidio Eric BSN, RN Entered By: Elpidio Eric on 10/07/2014 10:29:32

## 2014-10-08 NOTE — Progress Notes (Signed)
[name], [name] ([id number]) Visit Report for 10/07/2014 Chief Complaint Document Details Patient Name: [name], [name] Date of Service: 10/07/2014 10:15 AM Medical Record Number: [id number] Patient Account Number: [id number] Date of Birth/Sex: Oct 09, 1955 (59 y.o. Male) Treating RN: Clover Mealy, RN, BSN, [place] Sink Primary Care Physician: Bluford Main Other Clinician: Referring Physician: Treating Physician/Extender: BURNS III, Regis Bill in Treatment: 0 Information Obtained from: Patient Chief Complaint Right calf laceration. Electronic Signature(s) Signed: 10/07/2014 4:13:26 PM By: Madelaine Bhat MD Entered By: Madelaine Bhat on 10/07/2014 13:07:36 [name], [name] ([id number]) -------------------------------------------------------------------------------- Debridement Details Patient Name: [name] Date of Service: 10/07/2014 10:15 AM Medical Record Number: [id number] Patient Account Number: [id number] Date of Birth/Sex: Aug 15, 1955 (59 y.o. Male) Treating RN: Clover Mealy, RN, BSN, Rita Primary Care Physician: Bluford Main Other Clinician: Referring Physician: Treating Physician/Extender: BURNS III, Regis Bill in Treatment: 0 Debridement Performed for Wound #1 Right,Anterior Lower Leg Assessment: Performed By: Physician BURNS III, Melanie Crazier., MD Debridement: Debridement Pre-procedure Yes Verification/Time Out Taken: Start Time: 11:00 Pain Control: Lidocaine 4% Topical Solution Level: Skin/Subcutaneous Tissue Total Area Debrided (L x 0.5 (cm) x 5.5 (cm) = 2.75 (cm) W): Tissue and other Viable, Non-Viable, Eschar, Exudate, Fat, Fibrin/Slough, Subcutaneous material debrided: Instrument: Curette Bleeding: Minimum Hemostasis Achieved: Pressure End Time: 11:05 Procedural Pain: 0 Post Procedural Pain: 0 Response to Treatment: Procedure was tolerated well Post Debridement Measurements of Total Wound Length: (cm) 0.5 Width: (cm) 5.5 Depth: (cm) 0.3 Volume: (cm)  0.648 Post Procedure Diagnosis Same as Pre-procedure Electronic Signature(s) Signed: 10/07/2014 4:05:44 PM By: Elpidio Eric BSN, RN Signed: 10/07/2014 4:13:26 PM By: Madelaine Bhat MD Previous Signature: 10/07/2014 11:02:41 AM Version By: Elpidio Eric BSN, RN Entered By: Madelaine Bhat on 10/07/2014 13:07:06 Hinger, [name] ([id number]) -------------------------------------------------------------------------------- HPI Details Patient Name: [name] Date of Service: 10/07/2014 10:15 AM Medical Record Number: [id number] Patient Account Number: [id number] Date of Birth/Sex: 12-15-1955 (59 y.o. Male) Treating RN: Clover Mealy, RN, BSN, Rita Primary Care Physician: Bluford Main Other Clinician: Referring Physician: Treating Physician/Extender: BURNS III, Regis Bill in Treatment: 0 History of Present Illness HPI Description: was a 59 year old with history of diabetes (hemoglobin A1c 8.6 in August 2016), mitral valve and tricuspid valve repair in 2007, and congestive heart failure. No PAD. He was performing box jumps in the gym in late August 2016 and suffered a laceration to his right anterior calf. Underwent suture repair at urgent care and subsequently developed a wound infection and dehiscence. Improving with doxycycline and Keflex. Not performing dressing changes. Does not normally wear compression. Denies any significant pain. Ambulating per his baseline. No claudication or rest pain. No fever or chills. No significant drainage. Blood sugars less than 150. Electronic Signature(s) Signed: 10/07/2014 4:13:26 PM By: Madelaine Bhat MD Entered By: Madelaine Bhat on 10/07/2014 13:10:38 [name] ([id number]) -------------------------------------------------------------------------------- Physical Exam Details Patient Name: [name] Date of Service: 10/07/2014 10:15 AM Medical Record Number: [id number] Patient Account Number: [id number] Date of Birth/Sex:  December 26, 1955 (59 y.o. Male) Treating RN: Clover Mealy, RN, BSN, [place] Sink Primary Care Physician: Bluford Main Other Clinician: Referring Physician: Treating Physician/Extender: BURNS III, [name] in Treatment: 0 Constitutional . Pulse regular. Respirations normal and unlabored. Afebrile. Marland Kitchen Respiratory WNL. No retractions.. Cardiovascular Pedal Pulses WNL. Integumentary (Hair, Skin) .Marland Kitchen Neurological Sensation normal to touch, pin,and vibration. Psychiatric Judgement and insight Intact.. Oriented times 3.. No evidence of depression, anxiety, or agitation.. Notes Right anterior calf ulceration. Full-thickness. No exposed deep structures. No probe to bone. No cellulitis. 1+  pitting edema. Palpable DP. Right ABI 1.2. Electronic Signature(s) Signed: 10/07/2014 4:13:26 PM By: Madelaine Bhat MD Entered By: Madelaine Bhat on 10/07/2014 13:11:21 [name] ([id number]) -------------------------------------------------------------------------------- Physician Orders Details Patient Name: [name] Date of Service: 10/07/2014 10:15 AM Medical Record Number: [id number] Patient Account Number: [id number] Date of Birth/Sex: [birth year] (59 y.o. Male) Treating RN: Clover Mealy, RN, BSN, [place] Sink Primary Care Physician: Bluford Main Other Clinician: Referring Physician: Treating Physician/Extender: BURNS III, Regis Bill in Treatment: 0 Verbal / Phone Orders: Yes Clinician: Afful, RN, BSN, Rita Read Back and Verified: Yes Diagnosis Coding Wound Cleansing Wound #1 Right,Anterior Lower Leg o Cleanse wound with mild soap and water o May Shower, gently pat wound dry prior to applying new dressing. Anesthetic Wound #1 Right,Anterior Lower Leg o Topical Lidocaine 4% cream applied to wound bed prior to debridement Primary Wound Dressing Wound #1 Right,Anterior Lower Leg o Prisma Ag Secondary Dressing Wound #1 Right,Anterior Lower Leg o Boardered Foam Dressing Dressing  Change Frequency Wound #1 Right,Anterior Lower Leg o Change dressing every day. Follow-up Appointments Wound #1 Right,Anterior Lower Leg o Return Appointment in 2 Little. Edema Control Wound #1 Right,Anterior Lower Leg o Tubigrip Electronic Signature(s) Signed: 10/07/2014 8:06:00 AM By: Elpidio Eric BSN, RN Signed: 10/07/2014 4:13:26 PM By: Madelaine Bhat MD Entered By: Elpidio Eric on 10/07/2014 11:05:59 Gosdin, [name] ([id number]) [name] ([id number]) -------------------------------------------------------------------------------- Problem List Details Patient Name: [name] Date of Service: 10/07/2014 10:15 AM Medical Record Number: [id number] Patient Account Number: [id number] Date of Birth/Sex: [birth year] (59 y.o. 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 E11.622 Type 2 diabetes mellitus with other skin ulcer 10/07/2014 Yes Z95.4 Presence of other heart-valve replacement 10/07/2014 Yes I50.20 Unspecified systolic (congestive) heart failure 10/07/2014 Yes Inactive Problems Resolved Problems Electronic Signature(s) Signed: 10/07/2014 4:13:26 PM By: Madelaine Bhat MD Entered By: Madelaine Bhat on 10/07/2014 13:06:41 Gershman, [name] ([id number]) -------------------------------------------------------------------------------- Progress Note/History and Physical Details Patient Name: [name] Date of Service: 10/07/2014 10:15 AM Medical Record Number: [id number] Patient Account Number: [id number] Date of Birth/Sex: October 29, 1955 (59 y.o. Male) Treating RN:  Clover Mealy, RN, BSN, [place] Sink Primary Care Physician: Bluford Main Other Clinician: Referring Physician: Treating Physician/Extender: BURNS III, Regis Bill in Treatment: 0 Subjective Chief Complaint Information obtained from Patient Right calf laceration. History of Present Illness (HPI) was a 59 year old with history of diabetes (hemoglobin A1c 8.6 in August 2016), mitral valve and tricuspid valve repair in 2007, and congestive heart failure. No PAD. He was performing box jumps in the gym in late August 2016 and suffered a laceration to his right anterior calf. Underwent suture repair at urgent care and subsequently developed a wound infection and dehiscence. Improving with doxycycline and Keflex. Not performing dressing changes. Does not normally wear compression. Denies any significant pain. Ambulating per his baseline. No claudication or rest pain. No fever or chills. No significant drainage. Blood sugars less than 150. Wound History Patient presents with 1 open wound that has been present for approximately 5weeks. Patient has been treating wound in the following manner: aquaphor. Laboratory tests have not been performed in the last month. Patient reportedly has not tested positive for an antibiotic resistant organism.  Patient reportedly has not tested positive for osteomyelitis. Patient reportedly has not had testing performed to evaluate circulation in the legs. Patient History Information obtained from Patient. Allergies no known allergies Family History Diabetes - Mother, Father, Hypertension - Father, Mother, Seizures - Siblings, Stroke - Siblings, No family history of Cancer, Heart Disease, Hereditary Spherocytosis, Kidney Disease, Lung Disease, Thyroid Problems, Tuberculosis. Social History Never smoker, Marital Status - Single, Alcohol Use - Rarely, Drug Use - No History, Caffeine Use - Moderate. Medical History [name], [name] ([id number]) Eyes Denies history of  Cataracts, Glaucoma, Optic Neuritis Ear/Nose/Mouth/Throat Denies history of Chronic sinus problems/congestion Hematologic/Lymphatic Patient has history of Anemia Denies history of Hemophilia, Human Immunodeficiency Virus, Lymphedema, Sickle Cell Disease Respiratory Denies history of Aspiration, Asthma, Chronic Obstructive Pulmonary Disease (COPD), Pneumothorax, Sleep Apnea, Tuberculosis Cardiovascular Patient has history of Hypertension Gastrointestinal Denies history of Cirrhosis , Colitis, Crohn s, Hepatitis A, Hepatitis B, Hepatitis C Endocrine Patient has history of Type II Diabetes Immunological Denies history of Lupus Erythematosus, Raynaud s, Scleroderma Integumentary (Skin) Denies history of History of Burn, History of pressure wounds Musculoskeletal Denies history of Gout, Rheumatoid Arthritis, Osteoarthritis, Osteomyelitis Neurologic Denies history of Dementia, Neuropathy, Quadriplegia, Paraplegia, Seizure Disorder Oncologic Denies history of Received Chemotherapy, Received Radiation Psychiatric Denies history of Anorexia/bulimia, Confinement Anxiety Patient is treated with Insulin, Oral Agents. Blood sugar is tested. Medical And Surgical History Notes Cardiovascular left ventricle repair in 2007 Genitourinary Chronic kidney disease Review of Systems (ROS) Eyes Complains or has symptoms of Glasses / Contacts. Denies complaints or symptoms of Vision Changes. Ear/Nose/Mouth/Throat The patient has no complaints or symptoms. Respiratory The patient has no complaints or symptoms. Cardiovascular The patient has no complaints or symptoms. Gastrointestinal The patient has no complaints or symptoms. Endocrine The patient has no complaints or symptoms. Vogler, [name] Kitchen ([id number]) Genitourinary The patient has no complaints or symptoms. Immunological The patient has no complaints or symptoms. Integumentary (Skin) Complains or has symptoms of  Wounds. Musculoskeletal The patient has no complaints or symptoms. Neurologic The patient has no complaints or symptoms. Oncologic The patient has no complaints or symptoms. Psychiatric The patient has no complaints or symptoms. Objective Constitutional Pulse regular. Respirations normal and unlabored. Afebrile. Vitals Time Taken: 10:26 AM, Height: 67 in, Source: Stated, Weight: 256 lbs, Source: Measured, BMI: 40.1, Temperature: 98.1 F, Pulse: 54 bpm, Respiratory Rate: 18 breaths/min, Blood Pressure: 125/69 mmHg. Respiratory WNL. No retractions.. Cardiovascular Pedal Pulses WNL. Neurological Sensation normal to touch, pin,and vibration. Psychiatric Judgement and insight Intact.. Oriented times 3.. No evidence of depression, anxiety, or agitation.. General Notes: Right anterior calf ulceration. Full-thickness. No exposed deep structures. No probe to bone. No cellulitis. 1+ pitting edema. Palpable DP. Right ABI 1.2. Integumentary (Hair, Skin) Wound #1 status is Open. Original cause of wound was Trauma. The wound is located on the Right,Anterior Lower Leg. The wound measures 0.5cm length x 5.5cm width x 0.2cm depth; 2.16cm^2 area [facility], Randy L. ([id number]) and 0.432cm^3 volume. Assessment Active Problems ICD-10 S81.811A - Laceration without foreign body, right lower leg, initial encounter L97.212 - Non-pressure chronic ulcer of right calf with fat layer exposed E11.622 - Type 2 diabetes mellitus with other skin ulcer Z95.4 - Presence of other heart-valve replacement I50.20 - Unspecified systolic (congestive) heart failure Right anterior calf traumatic ulceration. Procedures Wound #1 Wound #1 is a Trauma, Other located on the Right,Anterior Lower Leg . There was a Skin/Subcutaneous Tissue Debridement ([id number]) debridement with total area of 2.75 sq cm performed by BURNS III, Randy W.,  MD. with the following instrument(s): Curette to remove Viable and Non-Viable  tissue/material including Exudate, Fat, Fibrin/Slough, Eschar, and Subcutaneous after achieving pain control using Lidocaine 4% Topical Solution. A time out was conducted prior to the start of the procedure. A Minimum amount of bleeding was controlled with Pressure. The procedure was tolerated well with a pain level of 0 throughout and a pain level of 0 following the procedure. Post Debridement Measurements: 0.5cm length x 5.5cm width x 0.3cm depth; 0.648cm^3 volume. Post procedure Diagnosis Wound #1: Same as Pre-Procedure Plan Wound Cleansing: Wound #1 Right,Anterior Lower Leg: Cleanse wound with mild soap and water May Shower, gently pat wound dry prior to applying new dressing. Anesthetic: [name], [name] ([id number]) Wound #1 Right,Anterior Lower Leg: Topical Lidocaine 4% cream applied to wound bed prior to debridement Primary Wound Dressing: Wound #1 Right,Anterior Lower Leg: Prisma Ag Secondary Dressing: Wound #1 Right,Anterior Lower Leg: Boardered Foam Dressing Dressing Change Frequency: Wound #1 Right,Anterior Lower Leg: Change dressing every day. Follow-up Appointments: Wound #1 Right,Anterior Lower Leg: Return Appointment in 2 Little. Edema Control: Wound #1 Right,Anterior Lower Leg: Tubigrip Promogran Prisma dressing changes. Edema control with Tubigrip. Complete antibiotics. Electronic Signature(s) Signed: 10/07/2014 4:13:26 PM By: Madelaine Bhat MD Entered By: Madelaine Bhat on 10/07/2014 13:13:00 [name] ([id number]) -------------------------------------------------------------------------------- ROS/PFSH Details Patient Name: [name] Date of Service: 10/07/2014 10:15 AM Medical Record Number: [id number] Patient Account Number: [id number] Date of Birth/Sex: January 20, 1955 (59 y.o. Male) Treating RN: Clover Mealy, RN, BSN, Rita Primary Care Physician: Bluford Main Other Clinician: Referring Physician: Treating Physician/Extender: BURNS III,  [name] in Treatment: 0 Label Progress Note Print Version as History and Physical for this encounter Information Obtained From Patient Wound History Do you currently have one or more open woundso Yes How many open wounds do you currently haveo 1 Approximately how long have you had your woundso 5weeks How have you been treating your wound(s) until nowo aquaphor Has your wound(s) ever healed and then re-openedo No Have you had any lab work done in the past montho No Have you tested positive for an antibiotic resistant organism (MRSA, VRE)o No Have you tested positive for osteomyelitis (bone infection)o No Have you had any tests for circulation on your legso No Eyes Complaints and Symptoms: Positive for: Glasses / Contacts Negative for: Vision Changes Medical History: Negative for: Cataracts; Glaucoma; Optic Neuritis Integumentary (Skin) Complaints and Symptoms: Positive for: Wounds Medical History: Negative for: History of Burn; History of pressure wounds Ear/Nose/Mouth/Throat Complaints and Symptoms: No Complaints or Symptoms Medical History: Negative for: Chronic sinus problems/congestion Hematologic/Lymphatic [name], [name] ([id number]) Medical History: Positive for: Anemia Negative for: Hemophilia; Human Immunodeficiency Virus; Lymphedema; Sickle Cell Disease Respiratory Complaints and Symptoms: No Complaints or Symptoms Medical History: Negative for: Aspiration; Asthma; Chronic Obstructive Pulmonary Disease (COPD); Pneumothorax; Sleep Apnea; Tuberculosis Cardiovascular Complaints and Symptoms: No Complaints or Symptoms Medical History: Positive for: Hypertension Past Medical History Notes: left ventricle repair in 2007 Gastrointestinal Complaints and Symptoms: No Complaints or Symptoms Medical History: Negative for: Cirrhosis ; Colitis; Crohnos; Hepatitis A; Hepatitis B; Hepatitis C Endocrine Complaints and Symptoms: No Complaints or Symptoms Medical  History: Positive for: Type II Diabetes Time with diabetes: 9years Treated with: Insulin, Oral agents Blood sugar tested every day: Yes Tested : Genitourinary Complaints and Symptoms: No Complaints or Symptoms Medical History: Past Medical History Notes: Chronic kidney disease Little, Randy L. ([id number]) Immunological Complaints and Symptoms: No Complaints or Symptoms Medical History: Negative for: Lupus Erythematosus; Raynaudos; Scleroderma Musculoskeletal  Complaints and Symptoms: No Complaints or Symptoms Medical History: Negative for: Gout; Rheumatoid Arthritis; Osteoarthritis; Osteomyelitis Neurologic Complaints and Symptoms: No Complaints or Symptoms Medical History: Negative for: Dementia; Neuropathy; Quadriplegia; Paraplegia; Seizure Disorder Oncologic Complaints and Symptoms: No Complaints or Symptoms Medical History: Negative for: Received Chemotherapy; Received Radiation Psychiatric Complaints and Symptoms: No Complaints or Symptoms Medical History: Negative for: Anorexia/bulimia; Confinement Anxiety Family and Social History Cancer: No; Diabetes: Yes - Mother, Father; Heart Disease: No; Hereditary Spherocytosis: No; Hypertension: Yes - Father, Mother; Kidney Disease: No; Lung Disease: No; Seizures: Yes - Siblings; Stroke: Yes - Siblings; Thyroid Problems: No; Tuberculosis: No; Never smoker; Marital Status - Single; Alcohol Use: Rarely; Drug Use: No History; Caffeine Use: Moderate; Financial Concerns: No; Food, Clothing or Shelter Needs: No; Support System Lacking: No; Transportation Concerns: No; Advanced Directives: No; Patient does not want information on Advanced Directives; Living Will: No Physician Affirmation I have reviewed and agree with the above information. [name], [name] ([id number]) Electronic Signature(s) Signed: 10/07/2014 4:05:44 PM By: Elpidio Eric BSN, RN Signed: 10/07/2014 4:13:26 PM By: Madelaine Bhat MD Previous Signature: 10/07/2014  7:41:03 AM Version By: Elpidio Eric BSN, RN Entered By: Madelaine Bhat on 10/07/2014 13:12:45 Farquharson, [name] ([id number]) -------------------------------------------------------------------------------- SuperBill Details Patient Name: [name] Date of Service: 10/07/2014 Medical Record Number: [id number] Patient Account Number: [id number] Date of Birth/Sex: 06/24/1955 (59 y.o. Male) Treating RN: Clover Mealy, RN, BSN, Rita Primary Care Physician: Bluford Main Other Clinician: Referring Physician: Treating Physician/Extender: BURNS III, Regis Bill in Treatment: 0 Diagnosis Coding ICD-10 Codes Code Description [phone] Laceration without foreign body, right lower leg, initial encounter [phone] Non-pressure chronic ulcer of right calf with fat layer exposed E11.622 Type 2 diabetes mellitus with other skin ulcer Z95.4 Presence of other heart-valve replacement I50.20 Unspecified systolic (congestive) heart failure Facility Procedures CPT4 Code Description: [id number] 99213 - WOUND CARE VISIT-LEV 3 EST PT Modifier: Quantity: 1 CPT4 Code Description: [id number] 11042 - DEB SUBQ TISSUE 20 SQ CM/< ICD-10 Description Diagnosis S81.811A Laceration without foreign body, right lower leg, init Modifier: ial encounte Quantity: 1 r Physician Procedures CPT4 Code Description: [id number] WC PHYS LEVEL 3 o NEW PT ICD-10 Description Diagnosis S81.811A Laceration without foreign body, right lower leg, init L97.212 Non-pressure chronic ulcer of right calf with fat laye E11.622 Type 2 diabetes mellitus with  other skin ulcer Modifier: ial encounte r exposed Quantity: 1 r CPT4 Code Description: [id number] 11042 - WC PHYS SUBQ TISS 20 SQ CM ICD-10 Description Diagnosis S81.811A Laceration without foreign body, right lower leg, init Modifier: ial encounte Quantity: 1 r Electronic Signature(s) Signed: 10/07/2014 4:13:26 PM By: Madelaine Bhat MD [name] ([id number]) Entered By: Madelaine Bhat  on 10/07/2014 13:12:21

## 2014-10-21 ENCOUNTER — Encounter: Payer: BLUE CROSS/BLUE SHIELD | Admitting: Surgery

## 2014-10-21 DIAGNOSIS — L97212 Non-pressure chronic ulcer of right calf with fat layer exposed: Secondary | ICD-10-CM | POA: Diagnosis not present

## 2014-10-21 NOTE — Progress Notes (Addendum)
[name] ([id number]) Visit Report for 10/21/2014 Arrival Information Details Patient Name: [name], [name] Date of Service: 10/21/2014 8:00 AM Medical Record Number: [id number] Patient Account Number: [id number] Date of Birth/Sex: June 14, 1955 (59 y.o. Male) Treating RN: Clover Mealy, RN, BSN, [place] Sink Primary Care Physician: Bluford Main Other Clinician: Referring Physician: Bluford Main Treating Physician/Extender: BURNS III, Regis Bill in Treatment: 2 Visit Information History Since Last Visit Any new allergies or adverse reactions: No Patient Arrived: Ambulatory Had a fall or experienced change in No Arrival Time: 08:14 activities of daily living that may affect Accompanied By: self risk of falls: Transfer Assistance: None Signs or symptoms of abuse/neglect since last No Patient Identification Verified: Yes visito Secondary Verification Process Yes Hospitalized since last visit: No Completed: Has Dressing in Place as Prescribed: Yes Patient Requires Transmission-Based No Pain Present Now: No Precautions: Patient Has Alerts: No Electronic Signature(s) Signed: 10/21/2014 8:20:08 AM By: Elpidio Eric BSN, RN Entered By: Elpidio Eric on 10/21/2014 08:20:08 [name] ([id number]) -------------------------------------------------------------------------------- Encounter Discharge Information Details Patient Name: [name] Date of Service: 10/21/2014 8:00 AM Medical Record Number: [id number] Patient Account Number: [id number] Date of Birth/Sex: 1955/06/01 (59 y.o. Male) Treating RN: Clover Mealy, RN, BSN, Rita Primary Care Physician: Bluford Main Other Clinician: Referring Physician: Bluford Main Treating Physician/Extender: BURNS III, Regis Bill in Treatment: 2 Encounter Discharge Information Items Discharge Pain Level: 0 Discharge Condition: Stable Ambulatory Status: Ambulatory Discharge Destination: Home Transportation: Private Auto Accompanied  By: self Schedule Follow-up Appointment: No Medication Reconciliation completed and provided to Patient/Care No Riata Ikeda: Provided on Clinical Summary of Care: 10/21/2014 Form Type Recipient Paper Patient LL Electronic Signature(s) Signed: 10/21/2014 8:44:38 AM By: Elpidio Eric BSN, RN Previous Signature: 10/21/2014 8:35:35 AM Version By: Gwenlyn Perking Entered By: Elpidio Eric on 10/21/2014 08:44:38 [name] ([id number]) -------------------------------------------------------------------------------- Lower Extremity Assessment Details Patient Name: [name] Date of Service: 10/21/2014 8:00 AM Medical Record Number: [id number] Patient Account Number: [id number] Date of Birth/Sex: 10-22-1955 (59 y.o. Male) Treating RN: Clover Mealy, RN, BSN, Rita Primary Care Physician: Bluford Main Other Clinician: Referring Physician: Bluford Main Treating Physician/Extender: BURNS III, Regis Bill in Treatment: 2 Edema Assessment Assessed: [Left: No] [Right: No] Edema: [Left: Ye] [Right: s] Calf Left: Right: Point of Measurement: 34 cm From Medial Instep cm 43.4 cm Ankle Left: Right: Point of Measurement: 8 cm From Medial Instep cm 26 cm Vascular Assessment Claudication: Claudication Assessment [Right:None] Pulses: Posterior Tibial Dorsalis Pedis Palpable: [Right:Yes] Extremity colors, hair growth, and conditions: Extremity Color: [Right:Dusky] Hair Growth on Extremity: [Right:Yes] Temperature of Extremity: [Right:Warm] Capillary Refill: [Right:< 3 seconds] Toe Nail Assessment Left: Right: Thick: Yes Discolored: Yes Deformed: No Improper Length and Hygiene: No Electronic Signature(s) Signed: 10/21/2014 8:22:52 AM By: Elpidio Eric BSN, RN Entered By: Elpidio Eric on 10/21/2014 08:22:52 [name] ([id number]) [name] ([id number]) -------------------------------------------------------------------------------- Multi Wound Chart Details Patient Name:  [name] Date of Service: 10/21/2014 8:00 AM Medical Record Number: [id number] Patient Account Number: [id number] Date of Birth/Sex: 04-Dec-1955 (59 y.o. Male) Treating RN: Clover Mealy, RN, BSN, Rita Primary Care Physician: Bluford Main Other Clinician: Referring Physician: Bluford Main Treating Physician/Extender: BURNS III, WALTER Weeks in Treatment: 2 Vital Signs Height(in): 67 Pulse(bpm): 66 Weight(lbs): 256 Blood Pressure 113/71 (mmHg): Body Mass Index(BMI): 40 Temperature(F): 97.8 Respiratory Rate 18 (breaths/min): Photos: [1:No Photos] [N/A:N/A] Wound Location: [1:Right Lower Leg - Anterior N/A] Wounding Event: [1:Trauma] [N/A:N/A] Primary Etiology: [1:Trauma, Other] [N/A:N/A] Comorbid History: [1:Anemia, Hypertension, Type II Diabetes] [N/A:N/A] Date Acquired: [1:09/08/2014] [N/A:N/A] Weeks of Treatment: [  1:2] [N/A:N/A] Wound Status: [1:Open] [N/A:N/A] Measurements L x W x D 0.4x4x0.2 [N/A:N/A] (cm) Area (cm) : [1:1.257] [N/A:N/A] Volume (cm) : [1:0.251] [N/A:N/A] % Reduction in Area: [1:41.80%] [N/A:N/A] % Reduction in Volume: 41.90% [N/A:N/A] Classification: [1:Full Thickness Without Exposed Support Structures] [N/A:N/A] HBO Classification: [1:Grade 1] [N/A:N/A] Exudate Amount: [1:Small] [N/A:N/A] Exudate Type: [1:Serosanguineous] [N/A:N/A] Exudate Color: [1:red, brown] [N/A:N/A] Wound Margin: [1:Distinct, outline attached N/A] Granulation Amount: [1:Medium (34-66%)] [N/A:N/A] Granulation Quality: [1:Pink, Pale] [N/A:N/A] Necrotic Amount: [1:Medium (34-66%)] [N/A:N/A] Exposed Structures: [1:Fascia: No Fat: No Tendon: No Muscle: No] [N/A:N/A] Joint: No Bone: No Limited to Skin Breakdown Epithelialization: None N/A N/A Periwound Skin Texture: Edema: Yes N/A N/A Excoriation: No Induration: No Callus: No Crepitus: No Fluctuance: No Friable: No Rash: No Scarring: No Periwound Skin Moist: Yes N/A N/A Moisture: Maceration: No Dry/Scaly:  No Periwound Skin Color: Atrophie Blanche: No N/A N/A Cyanosis: No Ecchymosis: No Erythema: No Hemosiderin Staining: No Mottled: No Pallor: No Rubor: No Temperature: No Abnormality N/A N/A Tenderness on No N/A N/A Palpation: Wound Preparation: Ulcer Cleansing: N/A N/A Rinsed/Irrigated with Saline Topical Anesthetic Applied: Other: lidocaine 4% Treatment Notes Electronic Signature(s) Signed: 10/21/2014 8:24:44 AM By: Elpidio Eric BSN, RN Entered By: Elpidio Eric on 10/21/2014 08:24:44 [name] ([id number]) -------------------------------------------------------------------------------- Multi-Disciplinary Care Plan Details Patient Name: [name] Date of Service: 10/21/2014 8:00 AM Medical Record Number: [id number] Patient Account Number: [id number] Date of Birth/Sex: 07-28-1955 (59 y.o. Male) Treating RN: Clover Mealy, RN, BSN, [place] Sink Primary Care Physician: Bluford Main Other Clinician: Referring Physician: Bluford Main Treating Physician/Extender: BURNS III, Regis Bill in Treatment: 2 Active Inactive Orientation to the Wound Care Program Nursing Diagnoses: Knowledge deficit related to the wound healing center program Goals: Patient/caregiver will verbalize understanding of the Wound Healing Center Program Date Initiated: 10/07/2014 Goal Status: Active Interventions: Provide education on orientation to the wound center Notes: Wound/Skin Impairment Nursing Diagnoses: Impaired tissue integrity Knowledge deficit related to ulceration/compromised skin integrity Goals: Patient/caregiver will verbalize understanding of skin care regimen Date Initiated: 10/07/2014 Goal Status: Active Ulcer/skin breakdown will have a volume reduction of 30% by week 4 Date Initiated: 10/07/2014 Goal Status: Active Ulcer/skin breakdown will have a volume reduction of 50% by week 8 Date Initiated: 10/07/2014 Goal Status: Active Ulcer/skin breakdown will have a volume reduction  of 80% by week 12 Date Initiated: 10/07/2014 Goal Status: Active Ulcer/skin breakdown will heal within 14 weeks Date Initiated: 10/07/2014 [name] ([id number]) Goal Status: Active Interventions: Assess patient/caregiver ability to obtain necessary supplies Assess patient/caregiver ability to perform ulcer/skin care regimen upon admission and as needed Assess ulceration(s) every visit Provide education on ulcer and skin care Treatment Activities: Referred to DME [facility] for dressing supplies : 10/21/2014 Skin care regimen initiated : 10/21/2014 Topical wound management initiated : 10/21/2014 Notes: Electronic Signature(s) Signed: 10/21/2014 8:24:36 AM By: Elpidio Eric BSN, RN Entered By: Elpidio Eric on 10/21/2014 08:24:36 Ferran, [name] ([id number]) -------------------------------------------------------------------------------- Pain Assessment Details Patient Name: [name] Date of Service: 10/21/2014 8:00 AM Medical Record Number: [id number] Patient Account Number: [id number] Date of Birth/Sex: 1955-12-09 (59 y.o. Male) Treating RN: Clover Mealy, RN, BSN, Rita Primary Care Physician: Bluford Main Other Clinician: Referring Physician: Bluford Main Treating Physician/Extender: BURNS III, Regis Bill in Treatment: 2 Active Problems Location of Pain Severity and Description of Pain Patient Has Paino No Site Locations Pain Management and Medication Current Pain Management: Electronic Signature(s) Signed: 10/21/2014 8:21:19 AM By: Elpidio Eric BSN, RN Entered By: Elpidio Eric on 10/21/2014 08:21:19 Ohnemus, [name] ([id number]) -------------------------------------------------------------------------------- Patient/Caregiver  Education Details Patient Name: [name], Randy L. Date of Service: 10/21/2014 8:00 AM Medical Record Number: [id number] Patient Account Number: [id number] Date of Birth/Gender: May 19, 1955 [age] y.o. Male) Treating RN: Clover MealyAfful, RN, BSN, [place] Sinkita Primary  Care Physician: Bluford Mainejan-Sie, Sheikh Other Clinician: Referring Physician: Bluford Mainejan-Sie, Sheikh Treating Physician/Extender: BURNS III, Regis BillWALTER Weeks in Treatment: 2 Education Assessment Education Provided To: Patient Education Topics Provided Basic Hygiene: Methods: Explain/Verbal Responses: State content correctly Welcome To The [facility]: Methods: Explain/Verbal Responses: State content correctly Wound/Skin Impairment: Methods: Explain/Verbal Responses: State content correctly Electronic Signature(s) Signed: 10/21/2014 8:44:52 AM By: Elpidio EricAfful, Rita BSN, RN Entered By: Elpidio EricAfful, Rita on 10/21/2014 08:44:52 [name], Randy ChanceLARRY L. ([id number]) -------------------------------------------------------------------------------- Wound Assessment Details Patient Name: [name], Randy L. Date of Service: 10/21/2014 8:00 AM Medical Record Number: [id number] Patient Account Number: [id number] Date of Birth/Sex: May 19, 1955 [age] y.o. Male) Treating RN: Clover MealyAfful, RN, BSN, Rita Primary Care Physician: Bluford Mainejan-Sie, Sheikh Other Clinician: Referring Physician: Bluford Mainejan-Sie, Sheikh Treating Physician/Extender: BURNS III, Regis BillWALTER Weeks in Treatment: 2 Wound Status Wound Number: 1 Primary Trauma, Other Etiology: Wound Location: Right Lower Leg - Anterior Wound Status: Open Wounding Event: Trauma Comorbid Anemia, Hypertension, Type II Date Acquired: 09/08/2014 History: Diabetes Weeks Of Treatment: 2 Clustered Wound: No Photos Photo Uploaded By: Elpidio EricAfful, Rita on 10/21/2014 15:53:22 Wound Measurements Length: (cm) 0.4 Width: (cm) 4 Depth: (cm) 0.2 Area: (cm) 1.257 Volume: (cm) 0.251 % Reduction in Area: 41.8% % Reduction in Volume: 41.9% Epithelialization: None Tunneling: No Undermining: No Wound Description Full Thickness Without Exposed Classification: Support Structures Diabetic Severity Grade 1 (Wagner): Wound Margin: Distinct, outline attached Exudate Amount: Small Exudate Type:  Serosanguineous Exudate Color: red, brown Foul Odor After Cleansing: No Wound Bed Granulation Amount: Medium (34-66%) Exposed Structure Granulation Quality: Pink, Pale Fascia Exposed: No [name], Randy L. ([id number]) Necrotic Amount: Medium (34-66%) Fat Layer Exposed: No Tendon Exposed: No Muscle Exposed: No Joint Exposed: No Bone Exposed: No Limited to Skin Breakdown Periwound Skin Texture Texture Color No Abnormalities Noted: No No Abnormalities Noted: No Callus: No Atrophie Blanche: No Crepitus: No Cyanosis: No Excoriation: No Ecchymosis: No Fluctuance: No Erythema: No Friable: No Hemosiderin Staining: No Induration: No Mottled: No Localized Edema: Yes Pallor: No Rash: No Rubor: No Scarring: No Temperature / Pain Moisture Temperature: No Abnormality No Abnormalities Noted: No Dry / Scaly: No Maceration: No Moist: Yes Wound Preparation Ulcer Cleansing: Rinsed/Irrigated with Saline Topical Anesthetic Applied: Other: lidocaine 4%, Treatment Notes Wound #1 (Right, Anterior Lower Leg) 1. Cleansed with: Clean wound with Normal Saline 4. Dressing Applied: Promogran 5. Secondary Dressing Applied Bordered Foam Dressing Electronic Signature(s) Signed: 10/21/2014 8:23:49 AM By: Elpidio EricAfful, Rita BSN, RN Entered By: Elpidio EricAfful, Rita on 10/21/2014 08:23:49 [name], Randy L. ([id number]) -------------------------------------------------------------------------------- Vitals Details Patient Name: [name], Randy L. Date of Service: 10/21/2014 8:00 AM Medical Record Number: [id number] Patient Account Number: [id number] Date of Birth/Sex: May 19, 1955 [age] y.o. Male) Treating RN: Clover MealyAfful, RN, BSN, Rita Primary Care Physician: Bluford Mainejan-Sie, Sheikh Other Clinician: Referring Physician: Bluford Mainejan-Sie, Sheikh Treating Physician/Extender: BURNS III, Regis BillWALTER Weeks in Treatment: 2 Vital Signs Time Taken: 08:21 Temperature (F): 97.8 Height (in): 67 Pulse (bpm): 66 Weight (lbs): 256 Respiratory  Rate (breaths/min): 18 Body Mass Index (BMI): 40.1 Blood Pressure (mmHg): 113/71 Reference Range: 80 - 120 mg / dl Electronic Signature(s) Signed: 10/21/2014 8:21:38 AM By: Elpidio EricAfful, Rita BSN, RN Entered By: Elpidio EricAfful, Rita on 10/21/2014 08:21:38

## 2014-10-22 NOTE — Progress Notes (Signed)
[name], Tannen L. ([id number]) Visit Report for 10/21/2014 Chief Complaint Document Details Patient Name: [name], Randy L. Date of Service: 10/21/2014 8:00 AM Medical Record Number: [id number] Patient Account Number: [id number] Date of Birth/Sex: [birth year] [age] y.o. Male) Treating RN: Clover MealyAfful, RN, BSN, [place] Sinkita Primary Care Physician: Bluford Mainejan-Sie, Sheikh Other Clinician: Referring Physician: Bluford Mainejan-Sie, Sheikh Treating Physician/Extender: BURNS III, Regis BillWALTER Weeks in Treatment: 2 Information Obtained from: Patient Chief Complaint Right calf laceration. Electronic Signature(s) Signed: 10/21/2014 3:24:17 PM By: Madelaine BhatBurns, III, Jaedynn Bohlken MD Entered By: Madelaine BhatBurns, III, Leslye Puccini on 10/21/2014 08:38:10 Rogue, Randy ChanceLARRY L. ([id number]) -------------------------------------------------------------------------------- Debridement Details Patient Name: [name], Randy L. Date of Service: 10/21/2014 8:00 AM Medical Record Number: [id number] Patient Account Number: [id number] Date of Birth/Sex: [birth year](59 y.o. Male) Treating RN: Clover MealyAfful, RN, BSN, Rita Primary Care Physician: Bluford Mainejan-Sie, Sheikh Other Clinician: Referring Physician: Bluford Mainejan-Sie, Sheikh Treating Physician/Extender: BURNS III, Regis BillWALTER Weeks in Treatment: 2 Debridement Performed for Wound #1 Right,Anterior Lower Leg Assessment: Performed By: Physician BURNS III, Melanie CrazierWALTER W., MD Debridement: Debridement Pre-procedure Yes Verification/Time Out Taken: Start Time: 08:28 Pain Control: Lidocaine 4% Topical Solution Level: Skin/Subcutaneous Tissue Total Area Debrided (L x 0.4 (cm) x 4 (cm) = 1.6 (cm) W): Tissue and other Viable, Non-Viable, Exudate, Fat, Fibrin/Slough, Subcutaneous material debrided: Instrument: Curette Bleeding: Minimum Hemostasis Achieved: Pressure End Time: 08:30 Procedural Pain: 0 Post Procedural Pain: 0 Response to Treatment: Procedure was tolerated well Post Debridement Measurements of Total Wound Length: (cm) 0.4 Width: (cm) 4 Depth:  (cm) 0.3 Volume: (cm) 0.377 Post Procedure Diagnosis Same as Pre-procedure Electronic Signature(s) Signed: 10/21/2014 3:24:17 PM By: Madelaine BhatBurns, III, Rai Sinagra MD Signed: 10/21/2014 3:54:59 PM By: Elpidio EricAfful, Rita BSN, RN Previous Signature: 10/21/2014 8:30:02 AM Version By: Elpidio EricAfful, Rita BSN, RN Entered By: Madelaine BhatBurns, III, Der Gagliano on 10/21/2014 08:38:02 Balzarini, Randy ChanceLARRY L. ([id number]) -------------------------------------------------------------------------------- HPI Details Patient Name: [name], Randy L. Date of Service: 10/21/2014 8:00 AM Medical Record Number: [id number] Patient Account Number: [id number] Date of Birth/Sex: [birth year] [age] y.o. Male) Treating RN: Clover MealyAfful, RN, BSN, Rita Primary Care Physician: Bluford Mainejan-Sie, Sheikh Other Clinician: Referring Physician: Bluford Mainejan-Sie, Sheikh Treating Physician/Extender: BURNS III, Regis BillWALTER Weeks in Treatment: 2 History of Present Illness HPI Description: Very pleasant 59 year old with history of diabetes (hemoglobin A1c 8.6 in August 2016), mitral valve and tricuspid valve repair in 2007, and congestive heart failure. No PAD. He was performing box jumps in the gym in late August 2016 and suffered a laceration to his right anterior calf. Underwent suture repair at urgent care and subsequently developed a wound infection and dehiscence. Improved with doxycycline and Keflex. Performing dressing changes with Promogran. Using Tubigrip for edema control. He returns to clinic for follow-up and is without complaints. No pain. No fever or chills. Minimal drainage. Electronic Signature(s) Signed: 10/21/2014 3:24:17 PM By: Madelaine BhatBurns, III, Ripken Rekowski MD Entered By: Madelaine BhatBurns, III, Mia Milan on 10/21/2014 08:39:18 Tyer, Randy ChanceLARRY L. ([id number]) -------------------------------------------------------------------------------- Physical Exam Details Patient Name: [name], Randy L. Date of Service: 10/21/2014 8:00 AM Medical Record Number: [id number] Patient Account Number: [id number] Date of  Birth/Sex: [birth year] (59 y.o. Male) Treating RN: Clover MealyAfful, RN, BSN, [place] Sinkita Primary Care Physician: Bluford Mainejan-Sie, Sheikh Other Clinician: Referring Physician: Bluford Mainejan-Sie, Sheikh Treating Physician/Extender: BURNS III, Jah Alarid Weeks in Treatment: 2 Constitutional . Pulse regular. Respirations normal and unlabored. Afebrile. . Notes Right anterior calf ulceration. Full-thickness. No exposed deep structures. No probe to bone. No cellulitis. 1+ pitting edema. Palpable DP. Right ABI 1.2. Electronic Signature(s) Signed: 10/21/2014 3:24:17 PM By: Madelaine BhatBurns, III, Ismael Treptow MD Entered By: Madelaine BhatBurns, III, Rilie Glanz on 10/21/2014 08:39:41 Moulder, Randy ChanceLARRY L. ([id number]) -------------------------------------------------------------------------------- Physician Orders  Details Patient Name: KEPLER, MCCABE Date of Service: 10/21/2014 8:00 AM Medical Record Number: [id number] Patient Account Number: [id number] Date of Birth/Sex: 1955-11-25 (59 y.o. Male) Treating RN: Clover Mealy, RN, BSN, [place] Sink Primary Care Physician: Bluford Main Other Clinician: Referring Physician: Bluford Main Treating Physician/Extender: BURNS III, Regis Bill in Treatment: 2 Verbal / Phone Orders: Yes Clinician: Afful, RN, BSN, Rita Read Back and Verified: Yes Diagnosis Coding Wound Cleansing Wound #1 Right,Anterior Lower Leg o Cleanse wound with mild soap and water o May Shower, gently pat wound dry prior to applying new dressing. Anesthetic Wound #1 Right,Anterior Lower Leg o Topical Lidocaine 4% cream applied to wound bed prior to debridement Primary Wound Dressing Wound #1 Right,Anterior Lower Leg o Promogran Secondary Dressing Wound #1 Right,Anterior Lower Leg o Boardered Foam Dressing Dressing Change Frequency Wound #1 Right,Anterior Lower Leg o Change dressing every day. Follow-up Appointments Wound #1 Right,Anterior Lower Leg o Return Appointment in 2 weeks. Edema Control Wound #1 Right,Anterior Lower Leg o  Tubigrip Electronic Signature(s) Signed: 10/21/2014 8:30:34 AM By: Elpidio Eric BSN, RN Signed: 10/21/2014 3:24:17 PM By: Madelaine Bhat MD Entered By: Elpidio Eric on 10/21/2014 08:30:33 Capurro, [name] ([id number]) [name] ([id number]) -------------------------------------------------------------------------------- Problem List Details Patient Name: [name] Date of Service: 10/21/2014 8:00 AM Medical Record Number: [id number] Patient Account Number: [id number] Date of Birth/Sex: 13-Dec-1955 (59 y.o. 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 E11.622 Type 2 diabetes mellitus with other skin ulcer 10/07/2014 Yes Z95.4 Presence of other heart-valve replacement 10/07/2014 Yes I50.20 Unspecified systolic (congestive) heart failure 10/07/2014 Yes Inactive Problems Resolved Problems Electronic Signature(s) Signed: 10/21/2014 3:24:17 PM By: Madelaine Bhat MD Entered By: Madelaine Bhat on 10/21/2014 08:37:41 [name], [name] ([id number]) -------------------------------------------------------------------------------- Progress Note Details Patient Name: [name] Date of Service: 10/21/2014 8:00 AM Medical Record Number: [id number] Patient Account Number: [id number] Date of Birth/Sex: 1955/05/04 (59 y.o. Male) Treating RN: Clover Mealy, RN, BSN, Rita Primary Care Physician: Bluford Main Other Clinician: Referring Physician: Bluford Main Treating Physician/Extender: BURNS III, Regis Bill in Treatment: 2 Subjective Chief Complaint Information  obtained from Patient Right calf laceration. History of Present Illness (HPI) Very pleasant 59 year old with history of diabetes (hemoglobin A1c 8.6 in August 2016), mitral valve and tricuspid valve repair in 2007, and congestive heart failure. No PAD. He was performing box jumps in the gym in late August 2016 and suffered a laceration to his right anterior calf. Underwent suture repair at urgent care and subsequently developed a wound infection and dehiscence. Improved with doxycycline and Keflex. Performing dressing changes with Promogran. Using Tubigrip for edema control. He returns to clinic for follow-up and is without complaints. No pain. No fever or chills. Minimal drainage. Objective Constitutional Pulse regular. Respirations normal and unlabored. Afebrile. Vitals Time Taken: 8:21 AM, Height: 67 in, Weight: 256 lbs, BMI: 40.1, Temperature: 97.8 F, Pulse: 66 bpm, Respiratory Rate: 18 breaths/min, Blood Pressure: 113/71 mmHg. General Notes: Right anterior calf ulceration. Full-thickness. No exposed deep structures. No probe to bone. No cellulitis. 1+ pitting edema. Palpable DP. Right ABI 1.2. Integumentary (Hair, Skin) Wound #1 status is Open. Original cause of wound was Trauma. The wound is located on the Right,Anterior  Lower Leg. The wound measures 0.4cm length x 4cm width x 0.2cm depth; 1.257cm^2 area and 0.251cm^3 volume. The wound is limited to skin breakdown. There is no tunneling or undermining noted. There is a small amount of serosanguineous drainage noted. The wound margin is distinct with the outline attached to the wound base. There is medium (34-66%) pink, pale granulation within the wound bed. [name], Randy L. ([id number]) There is a medium (34-66%) amount of necrotic tissue within the wound bed. The periwound skin appearance exhibited: Localized Edema, Moist. The periwound skin appearance did not exhibit: Callus, Crepitus, Excoriation, Fluctuance, Friable, Induration,  Rash, Scarring, Dry/Scaly, Maceration, Atrophie Blanche, Cyanosis, Ecchymosis, Hemosiderin Staining, Mottled, Pallor, Rubor, Erythema. Periwound temperature was noted as No Abnormality. Assessment Active Problems ICD-10 S81.811D - Laceration without foreign body, right lower leg, subsequent encounter L97.212 - Non-pressure chronic ulcer of right calf with fat layer exposed E11.622 - Type 2 diabetes mellitus with other skin ulcer Z95.4 - Presence of other heart-valve replacement I50.20 - Unspecified systolic (congestive) heart failure Right anterior calf traumatic ulceration. Procedures Wound #1 Wound #1 is a Trauma, Other located on the Right,Anterior Lower Leg . There was a Skin/Subcutaneous Tissue Debridement ([id number]) debridement with total area of 1.6 sq cm performed by BURNS III, Melanie Crazier., MD. with the following instrument(s): Curette to remove Viable and Non-Viable tissue/material including Exudate, Fat, Fibrin/Slough, and Subcutaneous after achieving pain control using Lidocaine 4% Topical Solution. A time out was conducted prior to the start of the procedure. A Minimum amount of bleeding was controlled with Pressure. The procedure was tolerated well with a pain level of 0 throughout and a pain level of 0 following the procedure. Post Debridement Measurements: 0.4cm length x 4cm width x 0.3cm depth; 0.377cm^3 volume. Post procedure Diagnosis Wound #1: Same as Pre-Procedure Plan Wound Cleansing: [name], Randy L. ([id number]) Wound #1 Right,Anterior Lower Leg: Cleanse wound with mild soap and water May Shower, gently pat wound dry prior to applying new dressing. Anesthetic: Wound #1 Right,Anterior Lower Leg: Topical Lidocaine 4% cream applied to wound bed prior to debridement Primary Wound Dressing: Wound #1 Right,Anterior Lower Leg: Promogran Secondary Dressing: Wound #1 Right,Anterior Lower Leg: Boardered Foam Dressing Dressing Change Frequency: Wound #1  Right,Anterior Lower Leg: Change dressing every day. Follow-up Appointments: Wound #1 Right,Anterior Lower Leg: Return Appointment in 2 weeks. Edema Control: Wound #1 Right,Anterior Lower Leg: Tubigrip Continue with Promogran dressing changes and Tubigrip for edema control. Electronic Signature(s) Signed: 10/21/2014 3:24:17 PM By: Madelaine Bhat MD Entered By: Madelaine Bhat on 10/21/2014 08:40:17 Crupi, [name] ([id number]) -------------------------------------------------------------------------------- SuperBill Details Patient Name: [name] Date of Service: 10/21/2014 Medical Record Number: [id number] Patient Account Number: [id number] Date of Birth/Sex: 01-06-1955 (59 y.o. 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 Z95.4 Presence of other heart-valve replacement I50.20 Unspecified systolic (congestive) heart failure Facility Procedures CPT4 Code Description: [id number] 11042 - DEB SUBQ TISSUE 20 SQ CM/< ICD-10 Description Diagnosis S81.811D Laceration without foreign body, right lower leg, s Modifier: ubsequent en Quantity: 1 counter Physician Procedures CPT4 Code Description: [id number] 11042 - WC PHYS SUBQ TISS 20 SQ CM ICD-10 Description Diagnosis S81.811D Laceration without foreign body, right lower leg, s  Modifier: ubsequent en Quantity: 1 counter Electronic Signature(s) Signed: 10/21/2014 3:24:17 PM By: Madelaine Bhat MD Entered By: Madelaine Bhat on 10/21/2014 08:40:28

## 2014-11-04 ENCOUNTER — Encounter: Payer: BLUE CROSS/BLUE SHIELD | Attending: Surgery | Admitting: Surgery

## 2014-11-04 DIAGNOSIS — X58XXXD Exposure to other specified factors, subsequent encounter: Secondary | ICD-10-CM | POA: Insufficient documentation

## 2014-11-04 DIAGNOSIS — L97212 Non-pressure chronic ulcer of right calf with fat layer exposed: Secondary | ICD-10-CM | POA: Insufficient documentation

## 2014-11-04 DIAGNOSIS — Z954 Presence of other heart-valve replacement: Secondary | ICD-10-CM | POA: Diagnosis not present

## 2014-11-04 DIAGNOSIS — E11622 Type 2 diabetes mellitus with other skin ulcer: Secondary | ICD-10-CM | POA: Insufficient documentation

## 2014-11-04 DIAGNOSIS — S81811D Laceration without foreign body, right lower leg, subsequent encounter: Secondary | ICD-10-CM | POA: Diagnosis not present

## 2014-11-04 DIAGNOSIS — I5020 Unspecified systolic (congestive) heart failure: Secondary | ICD-10-CM | POA: Diagnosis not present

## 2014-11-05 NOTE — Progress Notes (Signed)
COLBEY, WIRTANEN ([id number]) Visit Report for 11/04/2014 Arrival Information Details Patient Name: [name], [name] Date of Service: 11/04/2014 11:30 AM Medical Record Number: [id number] Patient Account Number: [id number] Date of Birth/Sex: [birth year] (59 y.o. Male) Treating RN: Huel Coventry Primary Care Physician: Bluford Main Other Clinician: Referring Physician: Bluford Main Treating Physician/Extender: BURNS III, Regis Bill in Treatment: 4 Visit Information History Since Last Visit Added or deleted any medications: No Patient Arrived: Ambulatory Any new allergies or adverse reactions: No Arrival Time: 11:37 Had a fall or experienced change in No Accompanied By: self activities of daily living that may affect Transfer Assistance: None risk of falls: Patient Identification Verified: Yes Signs or symptoms of abuse/neglect since last No Secondary Verification Process Yes visito Completed: Hospitalized since last visit: No Patient Requires Transmission-Based No Has Dressing in Place as Prescribed: Yes Precautions: Pain Present Now: No Patient Has Alerts: No Electronic Signature(s) Signed: 11/04/2014 5:27:52 PM By: Elliot Gurney, RN, BSN, Kim RN, BSN Entered By: Elliot Gurney, RN, BSN, Kim on 11/04/2014 11:41:38 [name], [name] ([id number]) -------------------------------------------------------------------------------- Clinic Level of Care Assessment Details Patient Name: [name] Date of Service: 11/04/2014 11:30 AM Medical Record Number: [id number] Patient Account Number: [id number] Date of Birth/Sex: [birth year] (59 y.o. Male) Treating RN: Huel Coventry Primary Care Physician: Bluford Main Other Clinician: Referring Physician: Bluford Main Treating Physician/Extender: BURNS III, Regis Bill in Treatment: 4 Clinic Level of Care Assessment Items TOOL 4 Quantity Score  - Use when only an EandM is performed on FOLLOW-UP visit 0 ASSESSMENTS - Nursing Assessment /  Reassessment  - Reassessment of Co-morbidities (includes updates in patient status) 0  - Reassessment of Adherence to Treatment Plan 0 ASSESSMENTS - Wound and Skin Assessment / Reassessment X - Simple Wound Assessment / Reassessment - one wound 1 5  - Complex Wound Assessment / Reassessment - multiple wounds 0  - Dermatologic / Skin Assessment (not related to wound area) 0 ASSESSMENTS - Focused Assessment  - Circumferential Edema Measurements - multi extremities 0  - Nutritional Assessment / Counseling / Intervention 0  - Lower Extremity Assessment (monofilament, tuning fork, pulses) 0  - Peripheral Arterial Disease Assessment (using hand held doppler) 0 ASSESSMENTS - Ostomy and/or Continence Assessment and Care  - Incontinence Assessment and Management 0  - Ostomy Care Assessment and Management (repouching, etc.) 0 PROCESS - Coordination of Care X - Simple Patient / Family Education for ongoing care 1 15  - Complex (extensive) Patient / Family Education for ongoing care 0  - Staff obtains Chiropractor, Records, Test Results / Process Orders 0  - Staff telephones HHA, Nursing Homes / Clarify orders / etc 0  - Routine Transfer to another Facility (non-emergent condition) 0 [name], Randy L. ([id number])  - Routine Hospital Admission (non-emergent condition) 0  - New Admissions / Manufacturing engineer / Ordering NPWT, Apligraf, etc. 0  - Emergency Hospital Admission (emergent condition) 0 X - Simple Discharge Coordination 1 10  - Complex (extensive) Discharge Coordination 0 PROCESS - Special Needs  - Pediatric / Minor Patient Management 0  - Isolation Patient Management 0  - Hearing / Language / Visual special needs 0  - Assessment of Community assistance (transportation, D/C planning, etc.) 0  - Additional assistance / Altered mentation 0  - Support Surface(s) Assessment (bed, cushion, seat, etc.) 0 INTERVENTIONS - Wound Cleansing /  Measurement X - Simple Wound Cleansing - one wound 1 5  - Complex Wound Cleansing - multiple wounds 0 X - Wound Imaging (photographs - any number of  wounds) 1 5  - Wound Tracing (instead of photographs) 0 X - Simple Wound Measurement - one wound 1 5  - Complex Wound Measurement - multiple wounds 0 INTERVENTIONS - Wound Dressings  - Small Wound Dressing one or multiple wounds 0  - Medium Wound Dressing one or multiple wounds 0  - Large Wound Dressing one or multiple wounds 0  - Application of Medications - topical 0  - Application of Medications - injection 0 INTERVENTIONS - Miscellaneous  - External ear exam 0 [name], Randy L. ([id number])  - Specimen Collection (cultures, biopsies, blood, body fluids, etc.) 0  - Specimen(s) / Culture(s) sent or taken to Lab for analysis 0  - Patient Transfer (multiple staff / Michiel Sites Lift / Similar devices) 0  - Simple Staple / Suture removal (25 or less) 0  - Complex Staple / Suture removal (26 or more) 0  - Hypo / Hyperglycemic Management (close monitor of Blood Glucose) 0  - Ankle / Brachial Index (ABI) - do not check if billed separately 0 X - Vital Signs 1 5 Has the patient been seen at the hospital within the last three years: Yes Total Score: 50 Level Of Care: New/Established - Level 2 Electronic Signature(s) Signed: 11/04/2014 5:27:52 PM By: Elliot Gurney, RN, BSN, Kim RN, BSN Entered By: Elliot Gurney, RN, BSN, Kim on 11/04/2014 11:53:49 [name], [name] ([id number]) -------------------------------------------------------------------------------- Encounter Discharge Information Details Patient Name: [name] Date of Service: 11/04/2014 11:30 AM Medical Record Number: [id number] Patient Account Number: [id number] Date of Birth/Sex: [birth year] (59 y.o. Male) Treating RN: Huel Coventry Primary Care Physician: Bluford Main Other Clinician: Referring Physician: Bluford Main Treating Physician/Extender: BURNS III,  Regis Bill in Treatment: 4 Encounter Discharge Information Items Discharge Pain Level: 0 Discharge Condition: Stable Ambulatory Status: Ambulatory Discharge Destination: Home Private Transportation: Auto Accompanied By: self Schedule Follow-up Appointment: Yes Medication Reconciliation completed and Yes provided to Patient/Care Nancyann Cotterman: Clinical Summary of Care: Electronic Signature(s) Signed: 11/04/2014 5:27:52 PM By: Elliot Gurney, RN, BSN, Kim RN, BSN Entered By: Elliot Gurney, RN, BSN, Kim on 11/04/2014 11:54:16 [name], [name] ([id number]) -------------------------------------------------------------------------------- Lower Extremity Assessment Details Patient Name: [name] Date of Service: 11/04/2014 11:30 AM Medical Record Number: [id number] Patient Account Number: [id number] Date of Birth/Sex: 1955-04-12 (59 y.o. Male) Treating RN: Huel Coventry Primary Care Physician: Bluford Main Other Clinician: Referring Physician: Bluford Main Treating Physician/Extender: BURNS III, Regis Bill in Treatment: 4 Edema Assessment Assessed: [Left: No] [Right: No] E[Left: dema] [Right: :] Calf Left: Right: Point of Measurement: 34 cm From Medial Instep cm 43 cm Ankle Left: Right: Point of Measurement: 8 cm From Medial Instep cm 26 cm Vascular Assessment Pulses: Posterior Tibial Dorsalis Pedis Palpable: [Right:Yes] Extremity colors, hair growth, and conditions: Extremity Color: [Right:Normal] Hair Growth on Extremity: [Right:Yes] Temperature of Extremity: [Right:Warm] Capillary Refill: [Right:< 3 seconds] Toe Nail Assessment Left: Right: Thick: Yes Discolored: Yes Deformed: No Improper Length and Hygiene: No Electronic Signature(s) Signed: 11/04/2014 5:27:52 PM By: Elliot Gurney, RN, BSN, Kim RN, BSN Entered By: Elliot Gurney, RN, BSN, Kim on 11/04/2014 11:45:13 [name], [name] ([id number]) -------------------------------------------------------------------------------- Multi Wound  Chart Details Patient Name: [name] Date of Service: 11/04/2014 11:30 AM Medical Record Number: [id number] Patient Account Number: [id number] Date of Birth/Sex: 09-03-1955 (59 y.o. Male) Treating RN: Huel Coventry Primary Care Physician: Bluford Main Other Clinician: Referring Physician: Bluford Main Treating Physician/Extender: BURNS III, WALTER Weeks in Treatment: 4 Vital Signs Height(in): 67 Pulse(bpm): 59 Weight(lbs): 256 Blood Pressure 148/74 (mmHg): Body Mass Index(BMI): 40 Temperature(F): 97.6 Respiratory  Rate 18 (breaths/min): Photos: [1:No Photos] [N/A:N/A] Wound Location: [1:Right, Anterior Lower Leg] [N/A:N/A] Wounding Event: [1:Trauma] [N/A:N/A] Primary Etiology: [1:Trauma, Other] [N/A:N/A] Date Acquired: [1:09/08/2014] [N/A:N/A] Weeks of Treatment: [1:4] [N/A:N/A] Wound Status: [1:Healed - Epithelialized] [N/A:N/A] Measurements L x W x D 0x0x0 [N/A:N/A] (cm) Area (cm) : [1:0] [N/A:N/A] Volume (cm) : [1:0] [N/A:N/A] % Reduction in Area: [1:100.00%] [N/A:N/A] % Reduction in Volume: 100.00% [N/A:N/A] Classification: [1:Full Thickness Without Exposed Support Structures] [N/A:N/A] Periwound Skin Texture: No Abnormalities Noted [N/A:N/A] Periwound Skin [1:No Abnormalities Noted] [N/A:N/A] Moisture: Periwound Skin Color: No Abnormalities Noted [N/A:N/A] Tenderness on [1:No] [N/A:N/A] Treatment Notes Electronic Signature(s) Signed: 11/04/2014 5:27:52 PM By: Elliot GurneyWoody, RN, BSN, Kim RN, BSN Entered By: Elliot GurneyWoody, RN, BSN, Kim on 11/04/2014 11:53:02 [name], Randy ChanceLARRY L. ([id number]) [name], Randy ChanceLARRY L. ([id number]) -------------------------------------------------------------------------------- Multi-Disciplinary Care Plan Details Patient Name: Randy GrandchildLYNCH, Mcarthur L. Date of Service: 11/04/2014 11:30 AM Medical Record Number: [id number] Patient Account Number: [id number] Date of Birth/Sex: 09/12/1955 (59 y.o. Male) Treating RN: Huel CoventryWoody, Kim Primary Care Physician: Bluford Mainejan-Sie,  Sheikh Other Clinician: Referring Physician: Bluford Mainejan-Sie, Sheikh Treating Physician/Extender: BURNS III, Regis BillWALTER Weeks in Treatment: 4 Active Inactive Electronic Signature(s) Signed: 11/04/2014 5:27:52 PM By: Elliot GurneyWoody, RN, BSN, Kim RN, BSN Entered By: Elliot GurneyWoody, RN, BSN, Kim on 11/04/2014 11:49:56 [name], Randy ChanceLARRY L. ([id number]) -------------------------------------------------------------------------------- Pain Assessment Details Patient Name: Randy GrandchildLYNCH, Harutyun L. Date of Service: 11/04/2014 11:30 AM Medical Record Number: [id number] Patient Account Number: [id number] Date of Birth/Sex: 09/12/1955 (59 y.o. Male) Treating RN: Huel CoventryWoody, Kim Primary Care Physician: Bluford Mainejan-Sie, Sheikh Other Clinician: Referring Physician: Bluford Mainejan-Sie, Sheikh Treating Physician/Extender: BURNS III, Regis BillWALTER Weeks in Treatment: 4 Active Problems Location of Pain Severity and Description of Pain Patient Has Paino No Site Locations Pain Management and Medication Current Pain Management: Electronic Signature(s) Signed: 11/04/2014 5:27:52 PM By: Elliot GurneyWoody, RN, BSN, Kim RN, BSN Entered By: Elliot GurneyWoody, RN, BSN, Kim on 11/04/2014 11:41:43 Sample, Randy ChanceLARRY L. ([id number]) -------------------------------------------------------------------------------- Patient/Caregiver Education Details Patient Name: Randy GrandchildLYNCH, Braxon L. Date of Service: 11/04/2014 11:30 AM Medical Record Number: [id number] Patient Account Number: [id number] Date of Birth/Gender: 09/12/1955 (59 y.o. Male) Treating RN: Huel CoventryWoody, Kim Primary Care Physician: Bluford Mainejan-Sie, Sheikh Other Clinician: Referring Physician: Bluford Mainejan-Sie, Sheikh Treating Physician/Extender: BURNS III, Regis BillWALTER Weeks in Treatment: 4 Education Assessment Education Provided To: Patient Education Topics Provided Basic Hygiene: Handouts: Other: apply lotion to leg Methods: Demonstration, Explain/Verbal Responses: State content correctly Electronic Signature(s) Signed: 11/04/2014 5:27:52 PM By: Elliot GurneyWoody, RN, BSN, Kim RN,  BSN Entered By: Elliot GurneyWoody, RN, BSN, Kim on 11/04/2014 11:54:36 Meeuwsen, Randy ChanceLARRY L. ([id number]) -------------------------------------------------------------------------------- Wound Assessment Details Patient Name: Randy GrandchildLYNCH, Harshaan L. Date of Service: 11/04/2014 11:30 AM Medical Record Number: [id number] Patient Account Number: [id number] Date of Birth/Sex: 09/12/1955 (59 y.o. Male) Treating RN: Huel CoventryWoody, Kim Primary Care Physician: Bluford Mainejan-Sie, Sheikh Other Clinician: Referring Physician: Bluford Mainejan-Sie, Sheikh Treating Physician/Extender: BURNS III, WALTER Weeks in Treatment: 4 Wound Status Wound Number: 1 Primary Etiology: Trauma, Other Wound Location: Right, Anterior Lower Leg Wound Status: Healed - Epithelialized Wounding Event: Trauma Date Acquired: 09/08/2014 Weeks Of Treatment: 4 Clustered Wound: No Photos Photo Uploaded By: Elliot GurneyWoody, RN, BSN, Kim on 11/04/2014 12:03:11 Wound Measurements Length: (cm) 0 % Reducti Width: (cm) 0 % Reducti Depth: (cm) 0 Area: (cm) 0 Volume: (cm) 0 on in Area: 100% on in Volume: 100% Wound Description Full Thickness Without Exposed Classification: Support Structures Periwound Skin Texture Texture Color No Abnormalities Noted: No No Abnormalities Noted: No Moisture No Abnormalities Noted: No Electronic Signature(s) Signed: 11/04/2014 5:27:52 PM By: Elliot GurneyWoody, RN, BSN, Kim RN, BSN Bogacz, Menelik L. ([id number]) Entered By: Elliot GurneyWoody,  RN, BSN, Kim on 11/04/2014 11:49:33 Kuennen, [name] ([id number]) -------------------------------------------------------------------------------- Vitals Details Patient Name: [name] Date of Service: 11/04/2014 11:30 AM Medical Record Number: [id number] Patient Account Number: [id number] Date of Birth/Sex: 04-29-1955 (59 y.o. Male) Treating RN: Huel Coventry Primary Care Physician: Bluford Main Other Clinician: Referring Physician: Bluford Main Treating Physician/Extender: BURNS III, Regis Bill in Treatment: 4 Vital  Signs Time Taken: 11:41 Temperature (F): 97.6 Height (in): 67 Pulse (bpm): 59 Weight (lbs): 256 Respiratory Rate (breaths/min): 18 Body Mass Index (BMI): 40.1 Blood Pressure (mmHg): 148/74 Reference Range: 80 - 120 mg / dl Electronic Signature(s) Signed: 11/04/2014 5:27:52 PM By: Elliot Gurney, RN, BSN, Kim RN, BSN Entered By: Elliot Gurney, RN, BSN, Kim on 11/04/2014 11:42:00

## 2014-11-05 NOTE — Progress Notes (Signed)
[name] ([id number]) Visit Report for 11/04/2014 Chief Complaint Document Details Patient Name: [name] Date of Service: 11/04/2014 11:30 AM Medical Record Number: [id number] Patient Account Number: [id number] Date of Birth/Sex: April 22, 1955 (59 y.o. Male) Treating RN: Huel Coventry Primary Care Physician: [name] Other Clinician: Referring Physician: Bluford Little Treating Physician/Extender: [name] in Treatment: 4 Information Obtained from: Patient Chief Complaint Right calf laceration. Electronic Signature(s) Signed: 11/04/2014 12:59:47 PM By: Madelaine Bhat MD Entered By: Madelaine Bhat on 11/04/2014 12:49:41 [name] ([id number]) -------------------------------------------------------------------------------- HPI Details Patient Name: [name] Date of Service: 11/04/2014 11:30 AM Medical Record Number: [id number] Patient Account Number: [id number] Date of Birth/Sex: 1955/05/23 (59 y.o. Male) Treating RN: Huel Coventry Primary Care Physician: [name] Other Clinician: Referring Physician: Bluford Little Treating Physician/Extender: [name] in Treatment: 4 History of Present Illness HPI Description: Very pleasant 59 year old with history of diabetes (hemoglobin A1c 8.6 in August 2016), mitral valve and tricuspid valve repair in 2007, and congestive heart failure. No PAD. He was performing box jumps in the gym in late August 2016 and suffered a laceration to his right anterior calf. Underwent suture repair at urgent care and subsequently developed a wound infection and dehiscence. Improved with doxycycline and Keflex. Performing dressing changes with Promogran. Using Tubigrip for edema control. He returns to clinic for follow-up and is without complaints. No pain. No fever or chills. No drainage. Electronic Signature(s) Signed: 11/04/2014 12:59:47 PM By: Madelaine Bhat MD Entered By: Madelaine Bhat on 11/04/2014 12:50:14 [name] ([id number]) -------------------------------------------------------------------------------- Physical Exam Details Patient Name: [name] Date of Service: 11/04/2014 11:30 AM Medical Record Number: [id number] Patient Account Number: [id number] Date of Birth/Sex: October 08, 1955 (59 y.o. Male) Treating RN: Huel Coventry Primary Care Physician: [name] Other Clinician: Referring Physician: Bluford Little Treating Physician/Extender: Randy III, Brynn Mulgrew Weeks in Treatment: 4 Constitutional . Pulse regular. Respirations normal and unlabored. Afebrile. . Notes Right anterior calf ulceration is completely re-epithelialized. No drainage. No infection. Nontender. 1+ pitting edema. Palpable DP. Right ABI 1.2. Electronic Signature(s) Signed: 11/04/2014 12:59:47 PM By: Madelaine Bhat MD Entered By: Madelaine Bhat on 11/04/2014 12:50:51 [name] ([id number]) -------------------------------------------------------------------------------- Physician Orders Details Patient Name: [name] Date of Service: 11/04/2014 11:30 AM Medical Record Number: [id number] Patient Account Number: [id number] Date of Birth/Sex: [birth year] (59 y.o. Male) Treating RN: Huel Coventry Primary Care Physician: [name] Other Clinician: Referring Physician: Bluford Little Treating Physician/Extender: [name] in Treatment: 4 Verbal / Phone Orders: Yes Clinician: Huel Coventry Read Back and Verified: Yes Diagnosis Coding Discharge From [facility] o Discharge from Wound Care Center - treatment complete Electronic Signature(s) Signed: 11/04/2014 12:59:47 PM By: Madelaine Bhat MD Signed: 11/04/2014 5:27:52 PM By: Randy Gurney RN, BSN, Kim RN, BSN Entered By: Randy Gurney, RN, BSN, [name] on 11/04/2014 11:53:24 [name] ([id number]) -------------------------------------------------------------------------------- Problem List  Details Patient Name: [name] Date of Service: 11/04/2014 11:30 AM Medical Record Number: [id number] Patient Account Number: [id number] Date of Birth/Sex: [birth year] (59 y.o. 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 ([id number]) -------------------------------------------------------------------------------- Progress Note Details Patient Name: Randy GrandchildLYNCH, Randy L. Date of Service: 11/04/2014 11:30 AM Medical Record Number: [id number] Patient Account Number: [id number] Date of Birth/Sex: 07-26-1955 (59 y.o. Male) Treating RN: Huel CoventryWoody, [name] Primary Care Physician: [name] Other Clinician: Referring Physician: Bluford Mainejan-Sie, Little Treating Physician/Extender: Randy III, Regis BillWALTER Weeks in Treatment: 4 Subjective Chief Complaint Information obtained from Patient Right calf laceration. History of Present Illness (HPI) Very pleasant 59 year old with history of diabetes (hemoglobin A1c 8.6 in August 2016), mitral valve and tricuspid valve repair in 2007, and congestive heart failure. No PAD. He was performing box jumps in the gym in late August 2016 and suffered a laceration to his right  anterior calf. Underwent suture repair at urgent care and subsequently developed a wound infection and dehiscence. Improved with doxycycline and Keflex. Performing dressing changes with Promogran. Using Tubigrip for edema control. He returns to clinic for follow-up and is without complaints. No pain. No fever or chills. No drainage. Objective Constitutional Pulse regular. Respirations normal and unlabored. Afebrile. Vitals Time Taken: 11:41 AM, Height: 67 in, Weight: 256 lbs, BMI: 40.1, Temperature: 97.6 F, Pulse: 59 bpm, Respiratory Rate: 18 breaths/min, Blood Pressure: 148/74 mmHg. General Notes: Right anterior calf ulceration is completely re-epithelialized. No drainage. No infection. Nontender. 1+ pitting edema. Palpable DP. Right ABI 1.2. Integumentary (Hair, Skin) Wound #1 status is Healed - Epithelialized. Original cause of wound was Trauma. The wound is located on the Right,Anterior Lower Leg. The wound measures 0cm length x 0cm width x 0cm depth; 0cm^2 area and 0cm^3 volume. [name], Randy ChanceLARRY L. ([id number]) Assessment Active Problems ICD-10 S81.811D - Laceration without foreign body, right lower leg, subsequent encounter L97.212 - Non-pressure chronic ulcer of right calf with fat layer exposed E11.622 - Type 2 diabetes mellitus with other skin ulcer Z95.4 - Presence of other heart-valve replacement I50.20 - Unspecified systolic (congestive) heart failure Healed right anterior calf traumatic ulceration. [facility] - treatment complete Eucerin cream o2 weeks. I recommended that he wear light compression stockings (15-20 mmHg) daily. I encouraged him to call with any questions or concerns. Otherwise, we will plan on seeing him back in the wound clinic on a when necessary basis. Electronic Signature(s) Signed: 11/04/2014 12:59:47 PM By: Madelaine BhatBurns, III, Amoy Steeves MD Entered By: Madelaine BhatBurns, III, Tina Gruner on 11/04/2014 12:52:39 [name],  Randy ChanceLARRY L. ([id number]) -------------------------------------------------------------------------------- SuperBill Details Patient Name: Randy GrandchildLYNCH, Thedford L. Date of Service: 11/04/2014 Medical Record Number: [id number] Patient Account Number: [id number] Date of Birth/Sex: 07-26-1955 (59 y.o. Male) Treating RN: Huel CoventryWoody, [name] Primary Care Physician: [name] Other Clinician: Referring Physician: Bluford Mainejan-Sie, Little Treating Physician/Extender: Randy III, Regis BillWALTER Weeks in Treatment: 4 Diagnosis Coding ICD-10 Codes Code Description S81.811D Laceration without foreign body, right lower leg, subsequent encounter L97.212 Non-pressure chronic ulcer of right calf with fat layer exposed E11.622 Type 2 diabetes mellitus with other skin ulcer Z95.4 Presence of other heart-valve replacement I50.20 Unspecified systolic (congestive) heart failure Facility Procedures CPT4 Code: [id number] Description: [id number] - WOUND CARE VISIT-LEV 2 EST PT Modifier: Quantity: 1 Physician Procedures CPT4 Code Description: [id number] - WC PHYS LEVEL 2 - EST PT ICD-10 Description Diagnosis S81.811D Laceration without foreign body, right lower leg, s Modifier: ubsequent en Quantity: 1 counter Electronic Signature(s) Signed: 11/04/2014 12:59:47 PM By:  Madelaine Bhat MD Entered By: Madelaine Bhat on 11/04/2014 12:52:54

## 2015-03-02 ENCOUNTER — Emergency Department: Payer: BLUE CROSS/BLUE SHIELD

## 2015-03-02 ENCOUNTER — Encounter: Payer: Self-pay | Admitting: Emergency Medicine

## 2015-03-02 ENCOUNTER — Inpatient Hospital Stay
Admission: EM | Admit: 2015-03-02 | Discharge: 2015-03-08 | DRG: 193 | Disposition: A | Discharge status: Home / Self Care | Payer: BLUE CROSS/BLUE SHIELD | Attending: Internal Medicine | Admitting: Internal Medicine

## 2015-03-02 DIAGNOSIS — N289 Disorder of kidney and ureter, unspecified: Secondary | ICD-10-CM | POA: Diagnosis present

## 2015-03-02 DIAGNOSIS — N189 Chronic kidney disease, unspecified: Secondary | ICD-10-CM | POA: Diagnosis present

## 2015-03-02 DIAGNOSIS — I5033 Acute on chronic diastolic (congestive) heart failure: Secondary | ICD-10-CM | POA: Diagnosis not present

## 2015-03-02 DIAGNOSIS — R7989 Other specified abnormal findings of blood chemistry: Secondary | ICD-10-CM

## 2015-03-02 DIAGNOSIS — I129 Hypertensive chronic kidney disease with stage 1 through stage 4 chronic kidney disease, or unspecified chronic kidney disease: Secondary | ICD-10-CM | POA: Diagnosis present

## 2015-03-02 DIAGNOSIS — N179 Acute kidney failure, unspecified: Secondary | ICD-10-CM | POA: Diagnosis present

## 2015-03-02 DIAGNOSIS — E1122 Type 2 diabetes mellitus with diabetic chronic kidney disease: Secondary | ICD-10-CM | POA: Diagnosis present

## 2015-03-02 DIAGNOSIS — E785 Hyperlipidemia, unspecified: Secondary | ICD-10-CM | POA: Diagnosis present

## 2015-03-02 DIAGNOSIS — I130 Hypertensive heart and chronic kidney disease with heart failure and stage 1 through stage 4 chronic kidney disease, or unspecified chronic kidney disease: Secondary | ICD-10-CM | POA: Diagnosis present

## 2015-03-02 DIAGNOSIS — R0902 Hypoxemia: Secondary | ICD-10-CM

## 2015-03-02 DIAGNOSIS — N183 Chronic kidney disease, stage 3 (moderate): Secondary | ICD-10-CM | POA: Diagnosis present

## 2015-03-02 DIAGNOSIS — R0602 Shortness of breath: Secondary | ICD-10-CM

## 2015-03-02 DIAGNOSIS — J9601 Acute respiratory failure with hypoxia: Secondary | ICD-10-CM | POA: Diagnosis present

## 2015-03-02 DIAGNOSIS — J189 Pneumonia, unspecified organism: Secondary | ICD-10-CM | POA: Diagnosis present

## 2015-03-02 DIAGNOSIS — Z79899 Other long term (current) drug therapy: Secondary | ICD-10-CM | POA: Diagnosis not present

## 2015-03-02 DIAGNOSIS — J811 Chronic pulmonary edema: Secondary | ICD-10-CM | POA: Diagnosis present

## 2015-03-02 DIAGNOSIS — R778 Other specified abnormalities of plasma proteins: Secondary | ICD-10-CM

## 2015-03-02 HISTORY — DX: Essential (primary) hypertension: I10

## 2015-03-02 HISTORY — DX: Obesity, unspecified: E66.9

## 2015-03-02 HISTORY — DX: Chronic kidney disease, stage 3 (moderate): N18.3

## 2015-03-02 HISTORY — DX: Pneumonia, unspecified organism: J18.9

## 2015-03-02 HISTORY — DX: Heart failure, unspecified: I50.9

## 2015-03-02 HISTORY — DX: Hyperlipidemia, unspecified: E78.5

## 2015-03-02 HISTORY — DX: Chronic kidney disease, stage 3 unspecified: N18.30

## 2015-03-02 LAB — CBC WITH DIFFERENTIAL/PLATELET
BASOS PCT: 1 %
Basophils Absolute: 0.1 10*3/uL (ref 0–0.1)
EOS ABS: 0.1 10*3/uL (ref 0–0.7)
EOS PCT: 1 %
HCT: 49 % (ref 40.0–52.0)
HEMOGLOBIN: 15.5 g/dL (ref 13.0–18.0)
LYMPHS ABS: 1.5 10*3/uL (ref 1.0–3.6)
Lymphocytes Relative: 13 %
MCH: 25.4 pg — AB (ref 26.0–34.0)
MCHC: 31.6 g/dL — ABNORMAL LOW (ref 32.0–36.0)
MCV: 80.3 fL (ref 80.0–100.0)
Monocytes Absolute: 1 10*3/uL (ref 0.2–1.0)
Monocytes Relative: 8 %
NEUTROS PCT: 77 %
Neutro Abs: 9.5 10*3/uL — ABNORMAL HIGH (ref 1.4–6.5)
PLATELETS: 202 10*3/uL (ref 150–440)
RBC: 6.1 MIL/uL — AB (ref 4.40–5.90)
RDW: 15 % — ABNORMAL HIGH (ref 11.5–14.5)
WBC: 12.2 10*3/uL — AB (ref 3.8–10.6)

## 2015-03-02 LAB — BASIC METABOLIC PANEL
Anion gap: 10 (ref 5–15)
BUN: 14 mg/dL (ref 6–20)
CHLORIDE: 102 mmol/L (ref 101–111)
CO2: 27 mmol/L (ref 22–32)
CREATININE: 1.83 mg/dL — AB (ref 0.61–1.24)
Calcium: 8.3 mg/dL — ABNORMAL LOW (ref 8.9–10.3)
GFR, EST AFRICAN AMERICAN: 45 mL/min — AB (ref >60)
GFR, EST NON AFRICAN AMERICAN: 39 mL/min — AB (ref >60)
Glucose, Bld: 98 mg/dL (ref 65–99)
POTASSIUM: 4 mmol/L (ref 3.5–5.1)
SODIUM: 139 mmol/L (ref 135–145)

## 2015-03-02 LAB — URINALYSIS COMPLETE WITH MICROSCOPIC (ARMC ONLY)
BACTERIA UA: NONE SEEN
BILIRUBIN URINE: NEGATIVE
Ketones, ur: NEGATIVE mg/dL
LEUKOCYTES UA: NEGATIVE
NITRITE: NEGATIVE
Protein, ur: NEGATIVE mg/dL
SQUAMOUS EPITHELIAL / LPF: NONE SEEN
Specific Gravity, Urine: 1.01 (ref 1.005–1.030)
pH: 5 (ref 5.0–8.0)

## 2015-03-02 LAB — RAPID INFLUENZA A&B ANTIGENS (ARMC ONLY)
INFLUENZA A (ARMC): NOT DETECTED — AB
INFLUENZA B (ARMC): NOT DETECTED — AB

## 2015-03-02 LAB — TROPONIN I: TROPONIN I: 0.05 ng/mL — AB (ref <0.031)

## 2015-03-02 LAB — GLUCOSE, CAPILLARY: GLUCOSE-CAPILLARY: 137 mg/dL — AB (ref 65–99)

## 2015-03-02 LAB — BRAIN NATRIURETIC PEPTIDE: B NATRIURETIC PEPTIDE 5: 154 pg/mL — AB (ref 0.0–100.0)

## 2015-03-02 MED ORDER — CEFTRIAXONE SODIUM 1 G IJ SOLR
1.0000 g | INTRAMUSCULAR | Status: DC
  Administered 2015-03-02 – 2015-03-04 (×3): 1 g via INTRAVENOUS
  Filled 2015-03-02 (×4): qty 10

## 2015-03-02 MED ORDER — CETYLPYRIDINIUM CHLORIDE 0.05 % MT LIQD
7.0000 mL | Freq: Two times a day (BID) | OROMUCOSAL | Status: DC
  Administered 2015-03-02 – 2015-03-08 (×11): 7 mL via OROMUCOSAL

## 2015-03-02 MED ORDER — INFLUENZA VAC SPLIT QUAD 0.5 ML IM SUSY
0.5000 mL | PREFILLED_SYRINGE | INTRAMUSCULAR | Status: AC
  Administered 2015-03-05: 0.5 mL via INTRAMUSCULAR
  Filled 2015-03-02: qty 0.5

## 2015-03-02 MED ORDER — IPRATROPIUM-ALBUTEROL 0.5-2.5 (3) MG/3ML IN SOLN
3.0000 mL | RESPIRATORY_TRACT | Status: DC | PRN
  Administered 2015-03-07: 3 mL via RESPIRATORY_TRACT
  Filled 2015-03-02: qty 3

## 2015-03-02 MED ORDER — ATORVASTATIN CALCIUM 20 MG PO TABS
20.0000 mg | ORAL_TABLET | Freq: Every day | ORAL | Status: DC
  Administered 2015-03-02 – 2015-03-07 (×6): 20 mg via ORAL
  Filled 2015-03-02 (×6): qty 1

## 2015-03-02 MED ORDER — AMLODIPINE BESYLATE 10 MG PO TABS
10.0000 mg | ORAL_TABLET | Freq: Every day | ORAL | Status: DC
Start: 2015-03-03 — End: 2015-03-08
  Administered 2015-03-03 – 2015-03-08 (×6): 10 mg via ORAL
  Filled 2015-03-02 (×6): qty 1

## 2015-03-02 MED ORDER — DEXTROSE 5 % IV SOLN: 1.0000 g | INTRAVENOUS | Status: DC

## 2015-03-02 MED ORDER — PNEUMOCOCCAL VAC POLYVALENT 25 MCG/0.5ML IJ INJ
0.5000 mL | INJECTION | INTRAMUSCULAR | Status: AC
  Administered 2015-03-05: 0.5 mL via INTRAMUSCULAR
  Filled 2015-03-02: qty 0.5

## 2015-03-02 MED ORDER — INSULIN ASPART 100 UNIT/ML ~~LOC~~ SOLN
0.0000 [IU] | Freq: Three times a day (TID) | SUBCUTANEOUS | Status: DC
  Administered 2015-03-03 (×2): 1 [IU] via SUBCUTANEOUS
  Administered 2015-03-05: 2 [IU] via SUBCUTANEOUS
  Administered 2015-03-05: 1 [IU] via SUBCUTANEOUS
  Administered 2015-03-05 – 2015-03-06 (×2): 2 [IU] via SUBCUTANEOUS
  Administered 2015-03-06: 5 [IU] via SUBCUTANEOUS
  Administered 2015-03-06 – 2015-03-07 (×3): 2 [IU] via SUBCUTANEOUS
  Administered 2015-03-07: 3 [IU] via SUBCUTANEOUS
  Administered 2015-03-08 (×2): 2 [IU] via SUBCUTANEOUS
  Filled 2015-03-02: qty 3
  Filled 2015-03-02: qty 2
  Filled 2015-03-02: qty 1
  Filled 2015-03-02 (×3): qty 2
  Filled 2015-03-02: qty 5
  Filled 2015-03-02 (×4): qty 2
  Filled 2015-03-02: qty 1

## 2015-03-02 MED ORDER — FUROSEMIDE 10 MG/ML IJ SOLN
20.0000 mg | Freq: Two times a day (BID) | INTRAMUSCULAR | Status: DC
  Administered 2015-03-02 – 2015-03-04 (×4): 20 mg via INTRAVENOUS
  Filled 2015-03-02 (×4): qty 2

## 2015-03-02 MED ORDER — DEXTROSE 5 % IV SOLN
500.0000 mg | INTRAVENOUS | Status: DC
  Administered 2015-03-03 – 2015-03-04 (×2): 500 mg via INTRAVENOUS
  Filled 2015-03-02 (×3): qty 500

## 2015-03-02 MED ORDER — DEXTROSE 5 % IV SOLN: 500.0000 mg | INTRAVENOUS | Status: DC

## 2015-03-02 MED ORDER — INSULIN ASPART 100 UNIT/ML ~~LOC~~ SOLN: 0.0000 [IU] | Freq: Every day | SUBCUTANEOUS | Status: DC

## 2015-03-02 MED ORDER — LISINOPRIL 5 MG PO TABS
2.5000 mg | ORAL_TABLET | Freq: Every day | ORAL | Status: DC
  Administered 2015-03-02 – 2015-03-03 (×2): 2.5 mg via ORAL
  Filled 2015-03-02 (×2): qty 1

## 2015-03-02 MED ORDER — ASPIRIN EC 81 MG PO TBEC
81.0000 mg | DELAYED_RELEASE_TABLET | Freq: Every day | ORAL | Status: DC
  Administered 2015-03-02 – 2015-03-08 (×7): 81 mg via ORAL
  Filled 2015-03-02 (×7): qty 1

## 2015-03-02 MED ORDER — INSULIN ASPART 100 UNIT/ML ~~LOC~~ SOLN: 0.0000 [IU] | Freq: Three times a day (TID) | SUBCUTANEOUS | Status: DC

## 2015-03-02 MED ORDER — INSULIN ASPART 100 UNIT/ML ~~LOC~~ SOLN
0.0000 [IU] | Freq: Every day | SUBCUTANEOUS | Status: DC
  Administered 2015-03-06: 2 [IU] via SUBCUTANEOUS
  Filled 2015-03-02: qty 1
  Filled 2015-03-02: qty 2

## 2015-03-02 MED ORDER — VANCOMYCIN HCL 10 G IV SOLR
1250.0000 mg | Freq: Once | INTRAVENOUS | Status: AC
  Administered 2015-03-03: 1250 mg via INTRAVENOUS
  Filled 2015-03-02: qty 1250

## 2015-03-02 MED ORDER — VANCOMYCIN HCL 10 G IV SOLR
1250.0000 mg | INTRAVENOUS | Status: DC
  Administered 2015-03-03: 1250 mg via INTRAVENOUS
  Filled 2015-03-02 (×3): qty 1250

## 2015-03-02 MED ORDER — METOPROLOL TARTRATE 50 MG PO TABS
50.0000 mg | ORAL_TABLET | Freq: Two times a day (BID) | ORAL | Status: DC
  Administered 2015-03-02 – 2015-03-08 (×12): 50 mg via ORAL
  Filled 2015-03-02 (×12): qty 1

## 2015-03-02 MED ORDER — GUAIFENESIN-DM 100-10 MG/5ML PO SYRP: 5.0000 mL | ORAL_SOLUTION | ORAL | Status: DC | PRN

## 2015-03-02 MED ORDER — SODIUM CHLORIDE 0.9 % IV BOLUS (SEPSIS)
1000.0000 mL | Freq: Once | INTRAVENOUS | Status: AC
Start: 2015-03-02 — End: 2015-03-02
  Administered 2015-03-02: 1000 mL via INTRAVENOUS

## 2015-03-02 MED ORDER — INSULIN DETEMIR 100 UNIT/ML ~~LOC~~ SOLN
55.0000 [IU] | Freq: Two times a day (BID) | SUBCUTANEOUS | Status: DC
  Administered 2015-03-02 – 2015-03-07 (×9): 55 [IU] via SUBCUTANEOUS
  Filled 2015-03-02 (×15): qty 0.55

## 2015-03-02 MED ORDER — LEVOFLOXACIN IN D5W 750 MG/150ML IV SOLN
750.0000 mg | Freq: Once | INTRAVENOUS | Status: AC
  Administered 2015-03-02: 750 mg via INTRAVENOUS
  Filled 2015-03-02: qty 150

## 2015-03-02 MED ORDER — HEPARIN SODIUM (PORCINE) 5000 UNIT/ML IJ SOLN
5000.0000 [IU] | Freq: Three times a day (TID) | INTRAMUSCULAR | Status: DC
  Administered 2015-03-02 – 2015-03-08 (×17): 5000 [IU] via SUBCUTANEOUS
  Filled 2015-03-02 (×17): qty 1

## 2015-03-02 NOTE — ED Notes (Signed)
Sandwich tray given to pt.

## 2015-03-02 NOTE — H&P (Addendum)
[facility]icians -  at [facility]   PATIENT NAME: [name]    MR#:  [id number]  DATE OF BIRTH:  1955-01-27  DATE OF ADMISSION:  03/02/2015  PRIMARY CARE PHYSICIAN: Luna Fuse, MD   REQUESTING/REFERRING PHYSICIAN: Rockne Menghini, MD  CHIEF COMPLAINT:   Chief Complaint  Patient presents with  . Cough  . Fever   cough with whitish sputum and fever 4 days.  HISTORY OF PRESENT ILLNESS:  [name]  is a 60 y.o. male with a known history of hypertension, diabetes and heart surgery. The patient presented the ED with the above chief complaint. He has had cough withwhitish sputum, fever and chills for the past 4 days. In addition, patient has shortness breath and weight gain. He was found hypoxia with oxygen saturation in the 80s. O2 saturation improved to 90s on 4 L oxygen and it cannula. He also complains of leg edema and weight gain, but denies any orthopnea or nocturnal dyspnea. CXR: Bilateral interstitial opacities are identified which may reflect interstitial pneumonia or pulmonary edema.  PAST MEDICAL HISTORY:   Past Medical History  Diagnosis Date  . Diabetes mellitus without complication (HCC)   . Hypertension     PAST SURGICAL HISTORY:   Past Surgical History  Procedure Laterality Date  . Repair of left ventricle laceration      SOCIAL HISTORY:   Social History  Substance Use Topics  . Smoking status: Never Smoker   . Smokeless tobacco: Not on file  . Alcohol Use: No    FAMILY HISTORY:  No family history on file. hypertension and diabetes.  DRUG ALLERGIES:  No Known Allergies  REVIEW OF SYSTEMS:  CONSTITUTIONAL: No fever, fatigue or weakness.  EYES: No blurred or double vision.  EARS, NOSE, AND THROAT: No tinnitus or ear pain.  RESPIRATORY: has cough, shortness of breath, no wheezing or hemoptysis.  CARDIOVASCULAR: No chest pain, orthopnea, has leg edema.  GASTROINTESTINAL: No nausea, vomiting,  or abdominal  pain. Diarrhea 2 days ago. GENITOURINARY: No dysuria, hematuria.  ENDOCRINE: No polyuria, nocturia,  HEMATOLOGY: No anemia, easy bruising or bleeding SKIN: No rash or lesion. MUSCULOSKELETAL: No joint pain or arthritis.   NEUROLOGIC: No tingling, numbness, weakness.  PSYCHIATRY: No anxiety or depression.   MEDICATIONS AT HOME:   Prior to Admission medications   Medication Sig Start Date End Date Taking? Authorizing Provider  amLODipine (NORVASC) 10 MG tablet Take 10 mg by mouth daily.   Yes Historical Provider, MD  aspirin EC 81 MG tablet Take 81 mg by mouth daily.   Yes Historical Provider, MD  atorvastatin (LIPITOR) 20 MG tablet Take 20 mg by mouth at bedtime.   Yes Historical Provider, MD  canagliflozin (INVOKANA) 300 MG TABS tablet Take 300 mg by mouth daily before breakfast.   Yes Historical Provider, MD  insulin detemir (LEVEMIR) 100 UNIT/ML injection Inject 55 Units into the skin 2 (two) times daily.   Yes Historical Provider, MD  metoprolol (LOPRESSOR) 50 MG tablet Take 50 mg by mouth 2 (two) times daily.   Yes Historical Provider, MD  saxagliptin HCl (ONGLYZA) 2.5 MG TABS tablet Take 5 mg by mouth daily.   Yes Historical Provider, MD  cephALEXin (KEFLEX) 500 MG capsule Take 1 capsule (500 mg total) by mouth 4 (four) times daily. Patient not taking: Reported on 03/02/2015 09/24/14   Charlesetta Ivory Menshew, PA-C      VITAL SIGNS:  Blood pressure 136/76, pulse 72, temperature 98.2 F (36.8 C),  temperature source Oral, resp. rate 16, height  (1.702 m), weight 112.492 kg (248 lb), SpO2 84 %.  PHYSICAL EXAMINATION:  GENERAL:  60 y.o.-year-old patient lying in the bed with no acute distress. Morbidly obese. EYES: Pupils equal, round, reactive to light and accommodation. No scleral icterus. Extraocular muscles intact.  HEENT: Head atraumatic, normocephalic. Oropharynx and nasopharynx clear.  NECK:  Supple, no jugular venous distention. No thyroid enlargement, no tenderness.   LUNGS: Normal breath sounds bilaterally, no wheezing, rales,rhonchi or crepitation. No use of accessory muscles of respiration.  CARDIOVASCULAR: S1, S2 normal. No murmurs, rubs, or gallops.  ABDOMEN: Soft, nontender, nondistended. Bowel sounds present. No organomegaly or mass.  EXTREMITIES: Bilateral legs trace edema, cyanosis, or clubbing.  NEUROLOGIC: Cranial nerves II through XII are intact. Muscle strength 5/5 in all extremities. Sensation intact. Gait not checked.  PSYCHIATRIC: The patient is alert and oriented x 3.  SKIN: No obvious rash, lesion, or ulcer.   LABORATORY PANEL:   CBC  Recent Labs Lab 03/02/15 1642  WBC 12.2*  HGB 15.5  HCT 49.0  PLT 202   ------------------------------------------------------------------------------------------------------------------  Chemistries   Recent Labs Lab 03/02/15 1642  NA 139  K 4.0  CL 102  CO2 27  GLUCOSE 98  BUN 14  CREATININE 1.83*  CALCIUM 8.3*   ------------------------------------------------------------------------------------------------------------------  Cardiac Enzymes  Recent Labs Lab 03/02/15 1642  TROPONINI 0.05*   ------------------------------------------------------------------------------------------------------------------  RADIOLOGY:  Dg Chest 2 View  03/02/2015  CLINICAL DATA:  Shortness of breath and cough EXAM: CHEST  2 VIEW COMPARISON:  None FINDINGS: Normal heart size. Previous median sternotomy. Diffuse bilateral interstitial opacities are identified. No focal consolidation noted. No pleural effusion identified. IMPRESSION: 1. Bilateral interstitial opacities are identified which may reflect interstitial pneumonia or pulmonary edema. Electronically Signed   By: Signa Kell M.D.   On: 03/02/2015 17:51    EKG:   Orders placed or performed during the hospital encounter of 03/02/15  . ED EKG  . ED EKG    IMPRESSION AND PLAN:   Bilateral pneumonia (CAP) with leukocytosis. The  patient will be admitted to medical floor. He was treated with Levaquin in the ED. I will start Zithromax and the Rocephin, follow-up CBC and cultures. Follow-up influenza test.  Acute respiratory failure with hypoxia. Continue oxygen by nasal cannula, DuoNeb when necessary.  Pulmonary edema with possible acute CHF. Start CHF protocol, Lasix 20 mg IV twice a day, get echocardiogram.  Acute renal failure on CKD. Follow up BMP.  Diabetes. Hold Onglyza and invokana. Start sliding scale and continue Levemir. Check hemoglobin A1c.    All the records are reviewed and case discussed with ED provider. Management plans discussed with the patient, family and they are in agreement.  CODE STATUS: Full code  TOTAL TIME TAKING CARE OF THIS PATIENT: 56 minutes.    Shaune Pollack M.D on 03/02/2015 at 6:41 PM  Between 7am to 6pm - Pager - [phone]  After 6pm go to [email or web address] - password [email or web address] [facility]  [place] [facility]  Office  [phone]  CC: Primary care physician; Luna Fuse, MD

## 2015-03-02 NOTE — Progress Notes (Signed)
Pharmacy Antibiotic Note  [name] is a 60 y.o. male admitted on 03/02/2015 with pneumonia.  Pharmacy has been consulted for Vancomycin dosing. Patient is also ordered Ceftriaxone and Azithromycin.  Ke=0.048 T1/2=14hr Vd=59L  Plan: Vancomycin 1250 IV every 18 hours.  Goal trough 15-20 mcg/mL.  Height:  (170.2 cm) Weight: 248 lb (112.492 kg) IBW/kg (Calculated) : 66.1  Temp (24hrs), Avg:97.9 F (36.6 C), Min:97.6 F (36.4 C), Max:98.2 F (36.8 C)   Recent Labs Lab 03/02/15 1642  WBC 12.2*  CREATININE 1.83*    Estimated Creatinine Clearance: 52.1 mL/min (by C-G formula based on Cr of 1.83).    No Known Allergies  Antimicrobials this admission: Vancomycin 2/28 >>  Ceftriaxone 2/27 >>  Azithromycin 3/1 >> Levaquin 2/28 >> 2/28  Dose adjustments this admission:  Microbiology results:   Thank you for allowing pharmacy to be a part of this patient's care.  Clovia Cuff, PharmD, BCPS 03/02/2015 9:30 PM

## 2015-03-02 NOTE — ED Notes (Addendum)
C/O cough, productive, x 4 days.  Also intermittent fevers.  Seen by PCP today and O2 Sat was low.  Sent to ED to eval for pneumonia vs. PE

## 2015-03-02 NOTE — ED Notes (Signed)
Troponin 0.05 , MD notified

## 2015-03-02 NOTE — ED Provider Notes (Signed)
[facility] Emergency Department Provider Note  ____________________________________________  Time seen: Approximately 6:05 PM  I have reviewed the triage vital signs and the nursing notes.   HISTORY  Chief Complaint Cough and Fever    HPI [name] is a 60 y.o. male with a history of HTN and DM presenting from clinic for hypoxia. The patient reports that for the past 4 days he has had a cough productive of thick white sputum associated with hot flashes and cold chills. He has also had congestion without rhinorrhea. He denies any sore throat or ear pain. He has had nausea and anorexia without vomiting or diarrhea. No abdominal pain. No myalgias. No chest pain or palpitations, or leg swelling. He has not noticed any shortness of breath. On arrival to his PCPs office for routine physical exam, the patient was noted to have oxygen saturations in the 80s. They improve to the mid 90s on 4 L nasal cannula.   Past Medical History  Diagnosis Date  . Diabetes mellitus without complication (HCC)   . Hypertension     There are no active problems to display for this patient.   Past Surgical History  Procedure Laterality Date  . Repair of left ventricle laceration      Current Outpatient Rx  Name  Route  Sig  Dispense  Refill  . cephALEXin (KEFLEX) 500 MG capsule   Oral   Take 1 capsule (500 mg total) by mouth 4 (four) times daily.   28 capsule   0   . HYDROcodone-acetaminophen (NORCO/VICODIN) 5-325 MG per tablet   Oral   Take 1 tablet by mouth every 4 (four) hours as needed for moderate pain.   20 tablet   0     Allergies Review of patient's allergies indicates no known allergies.  No family history on file.  Social History Social History  Substance Use Topics  . Smoking status: Never Smoker   . Smokeless tobacco: None  . Alcohol Use: No    Review of Systems Constitutional: Positive fever and chills. Positive general malaise. Negative  myalgias. Eyes: No visual changes. No eye discharge. ENT: No sore throat. Positive congestion without rhinorrhea. Negative ear pain. Cardiovascular: Denies chest pain, palpitations. Respiratory: Denies shortness of breath.  Positive productive cough. Positive hypoxia. Gastrointestinal: No abdominal pain.  Positive nausea, no vomiting. Positive anorexia. No diarrhea.  No constipation. Genitourinary: Negative for dysuria. Musculoskeletal: Negative for back pain. Skin: Negative for rash. Neurological: Negative for headaches, focal weakness or numbness.  10-point ROS otherwise negative.  ____________________________________________   PHYSICAL EXAM:  VITAL SIGNS: ED Triage Vitals  Enc Vitals Group     BP 03/02/15 1634 136/76 mmHg     Pulse Rate 03/02/15 1634 72     Resp 03/02/15 1634 16     Temp 03/02/15 1634 98.2 F (36.8 C)     Temp Source 03/02/15 1634 Oral     SpO2 03/02/15 1634 84 %     Weight 03/02/15 1634 248 lb (112.492 kg)     Height 03/02/15 1634  (1.702 m)     Head Cir --      Peak Flow --      Pain Score 03/02/15 1636 0     Pain Loc --      Pain Edu? --      Excl. in GC? --     Constitutional: Patient is alert and oriented and answering questions appropriately. He is mildly tachypnea but in no acute  distress at this time.  Eyes: Conjunctivae are normal.  EOMI. no scleral icterus. No eye discharge. Head: Atraumatic. Nose: Positive congestion without rhinorrhea.. Mouth/Throat: Mucous membranes are moist.  Neck: No stridor.  Supple.  No JVD. No meningismus. Cardiovascular: Normal rate, regular rhythm. No murmurs, rubs or gallops.  Respiratory: Patient is tachypnea and able to speak in 3-4 word sentences. He has no wheezes rales or rhonchi on exam, but he does have accessory muscle use and mild retractions. He has fair air exchange. I decreased his oxygen to 2 L nasal cannula and his oxygen saturations went down to 88%. He saturates at 97% on 4  L. Gastrointestinal: Obese. Soft and nontender. No distention. No peritoneal signs. Musculoskeletal: No LE edema. No palpable cords or tenderness to palpation in the calves. Neurologic:  Normal speech and language. No gross focal neurologic deficits are appreciated.  Skin:  Skin is warm, dry and intact. No rash noted. Psychiatric: Mood and affect are normal. Speech and behavior are normal.  Normal judgement.  ____________________________________________   LABS (all labs ordered are listed, but only abnormal results are displayed)  Labs Reviewed  CBC WITH DIFFERENTIAL/PLATELET - Abnormal; Notable for the following:    WBC 12.2 (*)    RBC 6.10 (*)    MCH 25.4 (*)    MCHC 31.6 (*)    RDW 15.0 (*)    Neutro Abs 9.5 (*)    All other components within normal limits  BASIC METABOLIC PANEL - Abnormal; Notable for the following:    Creatinine, Ser 1.83 (*)    Calcium 8.3 (*)    GFR calc non Af Amer 39 (*)    GFR calc Af Amer 45 (*)    All other components within normal limits  TROPONIN I - Abnormal; Notable for the following:    Troponin I 0.05 (*)    All other components within normal limits  BRAIN NATRIURETIC PEPTIDE - Abnormal; Notable for the following:    B Natriuretic Peptide 154.0 (*)    All other components within normal limits  CULTURE, BLOOD (ROUTINE X 2)  CULTURE, BLOOD (ROUTINE X 2)  URINE CULTURE  RAPID INFLUENZA A&B ANTIGENS ([facility] ONLY)  URINALYSIS COMPLETEWITH MICROSCOPIC ([facility] ONLY)   ____________________________________________  EKG  ED ECG REPORT I, Rockne Menghini, the attending physician, personally viewed and interpreted this ECG.   Date: 03/02/2015  EKG Time: 1647  Rate: 67  Rhythm: normal sinus rhythm  Axis: Leftward  Intervals:none  ST&T Change: No ST elevation.  ____________________________________________  RADIOLOGY  Dg Chest 2 View  03/02/2015  CLINICAL DATA:  Shortness of breath and cough EXAM: CHEST  2 VIEW COMPARISON:  None  FINDINGS: Normal heart size. Previous median sternotomy. Diffuse bilateral interstitial opacities are identified. No focal consolidation noted. No pleural effusion identified. IMPRESSION: 1. Bilateral interstitial opacities are identified which may reflect interstitial pneumonia or pulmonary edema. Electronically Signed   By: Signa Kell M.D.   On: 03/02/2015 17:51    ____________________________________________   PROCEDURES  Procedure(s) performed: None  Critical Care performed: No ____________________________________________   INITIAL IMPRESSION / ASSESSMENT AND PLAN / ED COURSE  Pertinent labs & imaging results that were available during my care of the patient were reviewed by me and considered in my medical decision making (see chart for details).  60 y.o. male without underlying lung disease presenting with several days of cough, fever, chills, and rhinorrhea, sent from his prior care physician for hypoxia. The patient has a community-acquired pneumonia. His chest  x-ray is consistent with opacities in the bilateral lung fields. He has noted white blood cell count of 12.2. Today, the patient has a mild renal insufficiency. He has not been having any chest pain and has no ischemic changes on his EKG, but does have an elevated troponin of 0.05. The most likely etiology of this is cardiac strain from his underlying infection and hypoxia. They will need to be trended by the admitting physicians. I have ordered antibiotics and cultures, and the patient will be admitted.  ____________________________________________  FINAL CLINICAL IMPRESSION(S) / ED DIAGNOSES  Final diagnoses:  CAP (community acquired pneumonia)  Hypoxia  Acute renal insufficiency      NEW MEDICATIONS STARTED DURING THIS VISIT:  New Prescriptions   No medications on file     Rockne Menghini, MD 03/02/15 1811

## 2015-03-03 ENCOUNTER — Inpatient Hospital Stay
Admit: 2015-03-03 | Discharge: 2015-03-03 | Disposition: A | Discharge status: Home / Self Care | Payer: BLUE CROSS/BLUE SHIELD | Attending: Internal Medicine | Admitting: Internal Medicine

## 2015-03-03 ENCOUNTER — Inpatient Hospital Stay: Admit: 2015-03-03 | Payer: BLUE CROSS/BLUE SHIELD

## 2015-03-03 LAB — BASIC METABOLIC PANEL
Anion gap: 6 (ref 5–15)
BUN: 14 mg/dL (ref 6–20)
CHLORIDE: 102 mmol/L (ref 101–111)
CO2: 32 mmol/L (ref 22–32)
CREATININE: 1.68 mg/dL — AB (ref 0.61–1.24)
Calcium: 8.4 mg/dL — ABNORMAL LOW (ref 8.9–10.3)
GFR calc non Af Amer: 43 mL/min — ABNORMAL LOW (ref >60)
GFR, EST AFRICAN AMERICAN: 50 mL/min — AB (ref >60)
Glucose, Bld: 112 mg/dL — ABNORMAL HIGH (ref 65–99)
Potassium: 4.5 mmol/L (ref 3.5–5.1)
Sodium: 140 mmol/L (ref 135–145)

## 2015-03-03 LAB — CBC
HEMATOCRIT: 46.8 % (ref 40.0–52.0)
HEMOGLOBIN: 15 g/dL (ref 13.0–18.0)
MCH: 26 pg (ref 26.0–34.0)
MCHC: 32.1 g/dL (ref 32.0–36.0)
MCV: 80.9 fL (ref 80.0–100.0)
Platelets: 206 10*3/uL (ref 150–440)
RBC: 5.79 MIL/uL (ref 4.40–5.90)
RDW: 15 % — ABNORMAL HIGH (ref 11.5–14.5)
WBC: 10.7 10*3/uL — ABNORMAL HIGH (ref 3.8–10.6)

## 2015-03-03 LAB — GLUCOSE, CAPILLARY
GLUCOSE-CAPILLARY: 80 mg/dL (ref 65–99)
GLUCOSE-CAPILLARY: 127 mg/dL — AB (ref 65–99)
Glucose-Capillary: 122 mg/dL — ABNORMAL HIGH (ref 65–99)
Glucose-Capillary: 121 mg/dL — ABNORMAL HIGH (ref 65–99)

## 2015-03-03 LAB — EXPECTORATED SPUTUM ASSESSMENT W REFEX TO RESP CULTURE

## 2015-03-03 LAB — EXPECTORATED SPUTUM ASSESSMENT W GRAM STAIN, RFLX TO RESP C

## 2015-03-03 LAB — HEMOGLOBIN A1C: Hgb A1c MFr Bld: 7.5 % — ABNORMAL HIGH (ref 4.0–6.0)

## 2015-03-03 MED ORDER — LISINOPRIL 5 MG PO TABS
5.0000 mg | ORAL_TABLET | Freq: Every day | ORAL | Status: DC
  Administered 2015-03-04 – 2015-03-08 (×5): 5 mg via ORAL
  Filled 2015-03-03 (×5): qty 1

## 2015-03-03 MED ORDER — LISINOPRIL 5 MG PO TABS
2.5000 mg | ORAL_TABLET | Freq: Once | ORAL | Status: AC
  Administered 2015-03-03: 2.5 mg via ORAL
  Filled 2015-03-03: qty 1

## 2015-03-03 NOTE — Progress Notes (Signed)
[facility] - [place] at [facility]   PATIENT NAME: [name]    MR#:  [id number]  DATE OF BIRTH:  07-Feb-1955  SUBJECTIVE:   Patient is here due to shortness of breath, cough and noted to be in respiratory failure and hypoxia. Feels better today. Still has a cough which is productive of clear sputum. Afebrile.  REVIEW OF SYSTEMS:    Review of Systems  Constitutional: Negative for fever and chills.  HENT: Negative for congestion and tinnitus.   Eyes: Negative for blurred vision and double vision.  Respiratory: Positive for cough, sputum production and shortness of breath. Negative for wheezing.   Cardiovascular: Negative for chest pain, orthopnea and PND.  Gastrointestinal: Negative for nausea, vomiting, abdominal pain and diarrhea.  Genitourinary: Negative for dysuria and hematuria.  Neurological: Negative for dizziness, sensory change and focal weakness.  All other systems reviewed and are negative.   Nutrition: Heart Healthy/Carb modified Tolerating Diet: Yes Tolerating PT: Ambulatory     DRUG ALLERGIES:  No Known Allergies  VITALS:  Blood pressure 132/74, pulse 65, temperature 97.9 F (36.6 C), temperature source Oral, resp. rate 18, height  (1.702 m), weight 119.976 kg (264 lb 8 oz), SpO2 91 %.  PHYSICAL EXAMINATION:   Physical Exam  GENERAL:  60 y.o.-year-old obese patient lying in the bed with no acute distress.  EYES: Pupils equal, round, reactive to light and accommodation. No scleral icterus. Extraocular muscles intact.  HEENT: Head atraumatic, normocephalic. Oropharynx and nasopharynx clear.  NECK:  Supple, no jugular venous distention. No thyroid enlargement, no tenderness.  LUNGS: Good air entry bilaterally, and expiratory wheezing bilaterally. No rhonchi, rales. No use of accessory muscles of respiration.  CARDIOVASCULAR: S1, S2 normal. No murmurs, rubs, or gallops.  ABDOMEN: Soft, nontender, nondistended. Bowel sounds  present. No organomegaly or mass.  EXTREMITIES: No cyanosis, clubbing or edema b/l.    NEUROLOGIC: Cranial nerves II through XII are intact. No focal Motor or sensory deficits b/l.   PSYCHIATRIC: The patient is alert and oriented x 3.  SKIN: No obvious rash, lesion, or ulcer.    LABORATORY PANEL:   CBC  Recent Labs Lab 03/03/15 0559  WBC 10.7*  HGB 15.0  HCT 46.8  PLT 206   ------------------------------------------------------------------------------------------------------------------  Chemistries   Recent Labs Lab 03/03/15 0559  NA 140  K 4.5  CL 102  CO2 32  GLUCOSE 112*  BUN 14  CREATININE 1.68*  CALCIUM 8.4*   ------------------------------------------------------------------------------------------------------------------  Cardiac Enzymes  Recent Labs Lab 03/02/15 1642  TROPONINI 0.05*   ------------------------------------------------------------------------------------------------------------------  RADIOLOGY:  Dg Chest 2 View  03/02/2015  CLINICAL DATA:  Shortness of breath and cough EXAM: CHEST  2 VIEW COMPARISON:  None FINDINGS: Normal heart size. Previous median sternotomy. Diffuse bilateral interstitial opacities are identified. No focal consolidation noted. No pleural effusion identified. IMPRESSION: 1. Bilateral interstitial opacities are identified which may reflect interstitial pneumonia or pulmonary edema. Electronically Signed   By: Signa Kell M.D.   On: 03/02/2015 17:51     ASSESSMENT AND PLAN:   60 year old male with past medical history of diabetes type 2 without complication, hypertension, previous history of pneumonia presents to the hospital at shortness of breath and cough and noted to be in acute respiratory failure.  #1 acute respiratory failure with hypoxia-this is secondary to pneumonia. -Continue IV antibiotics, oxygen support. Patient feels better today. -Continue duo nebs as needed.  #2 pneumonia-likely  community-acquired. -Continue ceftriaxone and Zithromax. Follow blood, sputum cultures. -Wean oxygen  as tolerated.  #3 essential hypertension-continue Norvasc, lisinopril, metoprolol.  #4 diabetes type 2 without complication-continue Levemir, sliding scale insulin. Blood sugar stable.  #5 hyperlipidemia-continue atorvastatin.   All the records are reviewed and case discussed with Care Management/Social Workerr. Management plans discussed with the patient, family and they are in agreement.  CODE STATUS: Full  DVT Prophylaxis: Heparin SQ  TOTAL TIME TAKING CARE OF THIS PATIENT: 30 minutes.   POSSIBLE D/C IN 1-2 DAYS, DEPENDING ON CLINICAL CONDITION.   Houston Siren M.D on 03/03/2015 at 4:01 PM  Between 7am to 6pm - Pager - [phone]  After 6pm go to [email or web address] - password [email or web address] [facility]  [place] [facility]  Office  [phone]  CC: Primary care physician; Luna Fuse, MD

## 2015-03-03 NOTE — Progress Notes (Signed)
Patient oxygen levels dropping throughout shift, patient denies any symptoms.  Increased to 6 liters with slight improvement.  Educated use of Facilities manager and patient is slightly improving.  Dropped oxygen to 5 liters.  Will need to continue to wean if at all possible.

## 2015-03-03 NOTE — Plan of Care (Signed)
Problem: Physical Regulation: Goal: Ability to maintain clinical measurements within normal limits will improve Outcome: Not Progressing Oxygen requirements increasing throughout the shift

## 2015-03-03 NOTE — Progress Notes (Signed)
Discontinued teley per Dr. Cherlynn Kaiser, notified CMT.

## 2015-03-03 NOTE — Care Management Note (Signed)
Case Management Note  Patient Details  Name: [name] MRN: [id number] Date of Birth: August 25, 1955  Subjective/Objective:                  RNCM consult received for home health heart failure program. Met with patient to discuss. Patient will not be home bound. He drives and currently trying to find employment. He has Obamacare health care coverage also. He is new to O2. His PCP is Dr. Elijio Miles. He uses [facility] for Rx and has not had any problems obtaining Rx. Patient refused home health RN/PT.  Action/Plan:  RNCM to follow for home O2. Maybe consider outpatient cardio pulmonary rehab.  Expected Discharge Date:                  Expected Discharge Plan:     In-House Referral:     Discharge planning Services  CM Consult  Post Acute Care Choice:    Choice offered to:  Patient  DME Arranged:    DME Agency:     HH Arranged:    [place] Agency:     Status of Service:  Completed, signed off  Medicare Important Message Given:    Date Medicare IM Given:    Medicare IM give by:    Date Additional Medicare IM Given:    Additional Medicare Important Message give by:     If discussed at [place] of Stay Meetings, dates discussed:    Additional Comments:  Marshell Garfinkel, RN 03/03/2015, 9:32 AM

## 2015-03-03 NOTE — Progress Notes (Signed)
Patient wanted to walk, prior to walking oxygen saturations 86% on 4 liters of oxygen.  Increased to 6 liters and assess oxygen saturations standing 89%, sitting 91%.  Called [facility] Respiratory Therapist, advised to keep at 6 liters, she will come and teach incentive spirometer.  Educated on cough and deep breathing exercises.  Will keep on 6 liters and let him recover and reassess and see if we can begin to wean patient after he has successfully completed rounds of incentive spirometer.

## 2015-03-03 NOTE — Clinical Documentation Improvement (Signed)
Internal Medicine  Can the diagnosis of CKD be further specified?  Thank you   CKD Stage I - GFR greater than or equal to 90  CKD Stage II - GFR 60-89  CKD Stage III - GFR 30-59  CKD Stage IV - GFR 15-29  CKD Stage V - GFR < 15  Other condition  Unable to clinically determine   Supporting Information:   ckd doc in h + P  Please exercise your independent, professional judgment when responding. A specific answer is not anticipated or expected.   Thank You, Lavonda Jumbo Health Information Management [place] [phone]

## 2015-03-04 ENCOUNTER — Inpatient Hospital Stay: Payer: BLUE CROSS/BLUE SHIELD

## 2015-03-04 ENCOUNTER — Encounter: Payer: Self-pay | Admitting: Specialist

## 2015-03-04 LAB — GLUCOSE, CAPILLARY
GLUCOSE-CAPILLARY: 84 mg/dL (ref 65–99)
GLUCOSE-CAPILLARY: 111 mg/dL — AB (ref 65–99)
Glucose-Capillary: 112 mg/dL — ABNORMAL HIGH (ref 65–99)
Glucose-Capillary: 171 mg/dL — ABNORMAL HIGH (ref 65–99)

## 2015-03-04 LAB — URINALYSIS COMPLETE WITH MICROSCOPIC (ARMC ONLY)
BACTERIA UA: NONE SEEN
BILIRUBIN URINE: NEGATIVE
Hgb urine dipstick: NEGATIVE
KETONES UR: NEGATIVE mg/dL
LEUKOCYTES UA: NEGATIVE
NITRITE: NEGATIVE
Protein, ur: NEGATIVE mg/dL
RBC / HPF: NONE SEEN RBC/hpf (ref 0–5)
SPECIFIC GRAVITY, URINE: 1.007 (ref 1.005–1.030)
pH: 5 (ref 5.0–8.0)

## 2015-03-04 LAB — URINE CULTURE: Culture: NO GROWTH

## 2015-03-04 LAB — HIV ANTIBODY (ROUTINE TESTING W REFLEX): HIV SCREEN 4TH GENERATION: NONREACTIVE

## 2015-03-04 LAB — BASIC METABOLIC PANEL
ANION GAP: 5 (ref 5–15)
BUN: 18 mg/dL (ref 6–20)
CHLORIDE: 101 mmol/L (ref 101–111)
CO2: 33 mmol/L — ABNORMAL HIGH (ref 22–32)
Calcium: 8.7 mg/dL — ABNORMAL LOW (ref 8.9–10.3)
Creatinine, Ser: 1.72 mg/dL — ABNORMAL HIGH (ref 0.61–1.24)
GFR, EST AFRICAN AMERICAN: 48 mL/min — AB (ref >60)
GFR, EST NON AFRICAN AMERICAN: 42 mL/min — AB (ref >60)
Glucose, Bld: 84 mg/dL (ref 65–99)
POTASSIUM: 4.6 mmol/L (ref 3.5–5.1)
SODIUM: 139 mmol/L (ref 135–145)

## 2015-03-04 LAB — STREP PNEUMONIAE URINARY ANTIGEN: Strep Pneumo Urinary Antigen: NEGATIVE

## 2015-03-04 MED ORDER — FUROSEMIDE 10 MG/ML IJ SOLN
40.0000 mg | Freq: Two times a day (BID) | INTRAMUSCULAR | Status: DC
  Administered 2015-03-04 – 2015-03-05 (×2): 40 mg via INTRAVENOUS
  Filled 2015-03-04 (×2): qty 4

## 2015-03-04 MED ORDER — TECHNETIUM TC 99M DIETHYLENETRIAME-PENTAACETIC ACID
32.6220 | Freq: Once | INTRAVENOUS | Status: AC | PRN
  Administered 2015-03-04: 32.622 via INTRAVENOUS

## 2015-03-04 MED ORDER — TECHNETIUM TO 99M ALBUMIN AGGREGATED
3.8740 | Freq: Once | INTRAVENOUS | Status: AC | PRN
  Administered 2015-03-04: 3.874 via INTRAVENOUS

## 2015-03-04 NOTE — Care Management (Signed)
O2 sats dropped with limited ambulation. RN contacted MD for CT to rule out PE. Chest xray pending.

## 2015-03-04 NOTE — Progress Notes (Signed)
[facility] Physicians - [place] at [facility]   PATIENT NAME: [name]    MR#:  [id number]  DATE OF BIRTH:  October 07, 1955  SUBJECTIVE:   Patient is here due to shortness of breath, cough and noted to be in acute respiratory failure with hypoxia. Patient clinically feels better since admission but continues to have significant hypoxia on minimal exertion. Still has a productive sputum which is clear.   REVIEW OF SYSTEMS:    Review of Systems  Constitutional: Negative for fever and chills.  HENT: Negative for congestion and tinnitus.   Eyes: Negative for blurred vision and double vision.  Respiratory: Positive for cough, sputum production and shortness of breath. Negative for wheezing.   Cardiovascular: Negative for chest pain, orthopnea and PND.  Gastrointestinal: Negative for nausea, vomiting, abdominal pain and diarrhea.  Genitourinary: Negative for dysuria and hematuria.  Neurological: Negative for dizziness, sensory change and focal weakness.  All other systems reviewed and are negative.   Nutrition: Heart Healthy/Carb modified Tolerating Diet: Yes Tolerating PT: Ambulatory     DRUG ALLERGIES:  No Known Allergies  VITALS:  Blood pressure 142/93, pulse 69, temperature 97.9 F (36.6 C), temperature source Oral, resp. rate 18, height  (1.702 m), weight 120.2 kg (264 lb 15.9 oz), SpO2 93 %.  PHYSICAL EXAMINATION:   Physical Exam  GENERAL:  60 y.o.-year-old obese patient sitting up in chair in no acute distress.  EYES: Pupils equal, round, reactive to light and accommodation. No scleral icterus. Extraocular muscles intact.  HEENT: Head atraumatic, normocephalic. Oropharynx and nasopharynx clear.  NECK:  Supple, no jugular venous distention. No thyroid enlargement, no tenderness.  LUNGS: Good air entry bilaterally,  No rhonchi, rales, wheezing. No use of accessory muscles of respiration.  CARDIOVASCULAR: S1, S2 normal. II/VI SEM at base, No rubs, or  gallops.  ABDOMEN: Soft, nontender, nondistended. Bowel sounds present. No organomegaly or mass.  EXTREMITIES: No cyanosis, clubbing, + 1 edema b/l NEUROLOGIC: Cranial nerves II through XII are intact. No focal Motor or sensory deficits b/l.   PSYCHIATRIC: The patient is alert and oriented x 3.  SKIN: No obvious rash, lesion, or ulcer.    LABORATORY PANEL:   CBC  Recent Labs Lab 03/03/15 0559  WBC 10.7*  HGB 15.0  HCT 46.8  PLT 206   ------------------------------------------------------------------------------------------------------------------  Chemistries   Recent Labs Lab 03/04/15 0702  NA 139  K 4.6  CL 101  CO2 33*  GLUCOSE 84  BUN 18  CREATININE 1.72*  CALCIUM 8.7*   ------------------------------------------------------------------------------------------------------------------  Cardiac Enzymes  Recent Labs Lab 03/02/15 1642  TROPONINI 0.05*   ------------------------------------------------------------------------------------------------------------------  RADIOLOGY:  Dg Chest 2 View  03/02/2015  CLINICAL DATA:  Shortness of breath and cough EXAM: CHEST  2 VIEW COMPARISON:  None FINDINGS: Normal heart size. Previous median sternotomy. Diffuse bilateral interstitial opacities are identified. No focal consolidation noted. No pleural effusion identified. IMPRESSION: 1. Bilateral interstitial opacities are identified which may reflect interstitial pneumonia or pulmonary edema. Electronically Signed   By: Signa Kell M.D.   On: 03/02/2015 17:51   Nm Pulmonary Perf And Vent  03/04/2015  CLINICAL DATA:  Bilateral pneumonia.  Hypoxia. EXAM: NUCLEAR MEDICINE VENTILATION - PERFUSION LUNG SCAN TECHNIQUE: Ventilation images were obtained in multiple projections using inhaled aerosol Tc-6m DTPA. Perfusion images were obtained in multiple projections after intravenous injection of Tc-59m MAA. RADIOPHARMACEUTICALS:  32.6 Technetium-42m DTPA aerosol inhalation and  3.9 Technetium-54m MAA IV COMPARISON:  03/02/2015 FINDINGS: Ventilation: No focal ventilation defect. Perfusion:  No wedge shaped peripheral perfusion defects to suggest acute pulmonary embolism. IMPRESSION: 1. Normal ventilation perfusion lung scan without perfusion defect to suggest embolism. Electronically Signed   By: Gaylyn Rong M.D.   On: 03/04/2015 14:11     ASSESSMENT AND PLAN:   60 year old male with past medical history of diabetes type 2 without complication, hypertension, previous history of pneumonia presents to the hospital at shortness of breath and cough and noted to be in acute respiratory failure.  #1 acute respiratory failure with hypoxia-etiology of this is unclear but suspected to be due to pneumonia and also underlying mild CHF. -Patient has significant hypoxemia on minimal exertion. Obtained a VQ scan given his chronic kidney disease which was negative for pulmonary embolism. -Continue IV Levaquin, O2 supplementation, continue aggressive diuresis for CHF. -We will attempt to wean O2 but patient likely will need to be discharged on home oxygen.  #2 CHF-acute on chronic diastolic dysfunction. -Echo results still pending. Continue diuresis with IV Lasix, continue metoprolol, lisinopril. -Follow I's and O's and daily weights.  #3 pneumonia-likely community-acquired. -Continue ceftriaxone and Zithromax. Cultures so far negative.  #4 essential hypertension-continue Norvasc, lisinopril, metoprolol.  #5 diabetes type 2 without complication-continue Levemir, sliding scale insulin. Blood sugar stable.  #6 hyperlipidemia-continue atorvastatin.  #7 chronic kidney disease stage III-patient's creatinine at baseline is around 1.5. -Currently elevated 1.7. We'll continue aggressive diuresis but will follow BUN and creatinine.  Possible DC home tomorrow on oxygen.  All the records are reviewed and case discussed with Care Management/Social Workerr. Management plans  discussed with the patient, family and they are in agreement.  CODE STATUS: Full  DVT Prophylaxis: Heparin SQ  TOTAL TIME TAKING CARE OF THIS PATIENT: 30 minutes.   POSSIBLE D/C IN 1-2 DAYS, DEPENDING ON CLINICAL CONDITION.   Houston Siren M.D on 03/04/2015 at 4:10 PM  Between 7am to 6pm - Pager - [phone]  After 6pm go to [email or web address] - password [email or web address] [facility]  [place] Hospital[facility]  Office  [phone]  CC: Primary care physician; Luna Fuse, MD

## 2015-03-04 NOTE — Progress Notes (Signed)
Attempting to walk patient to assess oxygen status. Pt oxygen saturation decreased to low 70's. Oxygen immediately put back on patient. Pt level increased to 92. Md notified, new orders placed. Will continue to assess and monitor patient. Patient with no complaints at present, denies any pain.

## 2015-03-05 ENCOUNTER — Inpatient Hospital Stay: Payer: BLUE CROSS/BLUE SHIELD

## 2015-03-05 LAB — GLUCOSE, CAPILLARY
GLUCOSE-CAPILLARY: 143 mg/dL — AB (ref 65–99)
GLUCOSE-CAPILLARY: 153 mg/dL — AB (ref 65–99)
GLUCOSE-CAPILLARY: 155 mg/dL — AB (ref 65–99)
Glucose-Capillary: 71 mg/dL (ref 65–99)
Glucose-Capillary: 185 mg/dL — ABNORMAL HIGH (ref 65–99)

## 2015-03-05 LAB — BASIC METABOLIC PANEL
ANION GAP: 7 (ref 5–15)
BUN: 23 mg/dL — ABNORMAL HIGH (ref 6–20)
CHLORIDE: 98 mmol/L — AB (ref 101–111)
CO2: 33 mmol/L — AB (ref 22–32)
Calcium: 8.5 mg/dL — ABNORMAL LOW (ref 8.9–10.3)
Creatinine, Ser: 1.61 mg/dL — ABNORMAL HIGH (ref 0.61–1.24)
GFR calc non Af Amer: 45 mL/min — ABNORMAL LOW (ref >60)
GFR, EST AFRICAN AMERICAN: 52 mL/min — AB (ref >60)
Glucose, Bld: 159 mg/dL — ABNORMAL HIGH (ref 65–99)
POTASSIUM: 4.5 mmol/L (ref 3.5–5.1)
Sodium: 138 mmol/L (ref 135–145)

## 2015-03-05 MED ORDER — FUROSEMIDE 10 MG/ML IJ SOLN
60.0000 mg | Freq: Three times a day (TID) | INTRAMUSCULAR | Status: DC
  Administered 2015-03-05 – 2015-03-06 (×3): 60 mg via INTRAVENOUS
  Filled 2015-03-05 (×3): qty 6

## 2015-03-05 MED ORDER — DOXYCYCLINE HYCLATE 100 MG PO TABS: 100.0000 mg | ORAL_TABLET | Freq: Two times a day (BID) | ORAL | Status: DC

## 2015-03-05 MED ORDER — DOXYCYCLINE HYCLATE 100 MG PO TABS
100.0000 mg | ORAL_TABLET | Freq: Two times a day (BID) | ORAL | Status: DC
  Administered 2015-03-05 – 2015-03-08 (×6): 100 mg via ORAL
  Filled 2015-03-05 (×6): qty 1

## 2015-03-05 MED ORDER — AZITHROMYCIN 250 MG PO TABS: 500.0000 mg | ORAL_TABLET | Freq: Every day | ORAL | Status: DC

## 2015-03-05 NOTE — Progress Notes (Addendum)
Pharmacy Antibiotic Note  Esaw GrandchildLarry L Spatz is a 60 y.o. male admitted on 03/02/2015 with pneumonia.    Plan: After discussion with Dr. Cherlynn KaiserSainani, he believes pt symptoms are related to CHF. Most recent chest x-ray shows vascular congestion, concern for pulmonary edema. Per Dr. Cherlynn KaiserSainani ok to narrow therapy. MD would like to finish therapy with doxycycline. Doxycycline 100mg  bid   Height: 5\' 7"  (170.2 cm) Weight: 265 lb (120.203 kg) IBW/kg (Calculated) : 66.1  Temp (24hrs), Avg:98.3 F (36.8 C), Min:98 F (36.7 C), Max:98.5 F (36.9 C)   Recent Labs Lab 03/02/15 1642 03/03/15 0559 03/04/15 0702 03/05/15 0628  WBC 12.2* 10.7*  --   --   CREATININE 1.83* 1.68* 1.72* 1.61*    Estimated Creatinine Clearance: 61.3 mL/min (by C-G formula based on Cr of 1.61).    No Known Allergies  Antimicrobials this admission: Vancomycin 2/28 >> 3/1 Ceftriaxone 2/27 >> 3/3 Azithromycin 3/1 >>3/3 Levaquin 2/28 >> 2/28 Doxycycline 3/3>>  Dose adjustments this admission:  Microbiology results: Sputum=normal flora Blood-neg Urine-neg   Thank you for allowing pharmacy to be a part of this patient's care.  Olene FlossMelissa D Nyoka Alcoser, Pharm.D Clinical Pharmacist   03/05/2015 2:59 PM

## 2015-03-05 NOTE — Care Management (Signed)
Need O2 assessment template. O2 referral sent to [facility] with [facility]. Patient not ready for discharge per Dr. Cherlynn KaiserSainani. RNCM will continue to follow. OP cardiopulmonary rehab would be beneficial and patient agreed. He declined home health.

## 2015-03-05 NOTE — Progress Notes (Signed)
[facility] Physicians - [place] at [facility]   PATIENT NAME: [name]    MR#:  [id number]  DATE OF BIRTH:  1955/07/30  SUBJECTIVE:   Patient is here due to shortness of breath, cough and noted to be in acute respiratory failure with hypoxia. Chest x-ray this morning showing pulmonary edema and vascular congestion. Clinically patient still complaining of some shortness of breath.  REVIEW OF SYSTEMS:    Review of Systems  Constitutional: Negative for fever and chills.  HENT: Negative for congestion and tinnitus.   Eyes: Negative for blurred vision and double vision.  Respiratory: Positive for cough, sputum production and shortness of breath. Negative for wheezing.   Cardiovascular: Negative for chest pain, orthopnea and PND.  Gastrointestinal: Negative for nausea, vomiting, abdominal pain and diarrhea.  Genitourinary: Negative for dysuria and hematuria.  Neurological: Negative for dizziness, sensory change and focal weakness.  All other systems reviewed and are negative.   Nutrition: Heart Healthy/Carb modified Tolerating Diet: Yes Tolerating PT: Ambulatory     DRUG ALLERGIES:  No Known Allergies  VITALS:  Blood pressure 113/81, pulse 74, temperature 98.6 F (37 C), temperature source Oral, resp. rate 18, height  (1.702 m), weight 120.203 kg (265 lb), SpO2 93 %.  PHYSICAL EXAMINATION:   Physical Exam  GENERAL:  60 y.o.-year-old obese patient sitting up in chair lethargic but in NAD EYES: Pupils equal, round, reactive to light and accommodation. No scleral icterus. Extraocular muscles intact.  HEENT: Head atraumatic, normocephalic. Oropharynx and nasopharynx clear.  NECK:  Supple, no jugular venous distention. No thyroid enlargement, no tenderness.  LUNGS: Good air entry bilaterally,  No rhonchi, rales, wheezing. No use of accessory muscles of respiration.  CARDIOVASCULAR: S1, S2 normal. II/VI SEM at base, No rubs, or gallops.  ABDOMEN: Soft,  nontender, nondistended. Bowel sounds present. No organomegaly or mass.  EXTREMITIES: No cyanosis, clubbing, + 1-2 edema b/l NEUROLOGIC: Cranial nerves II through XII are intact. No focal Motor or sensory deficits b/l.    PSYCHIATRIC: The patient is alert and oriented x 3.  SKIN: No obvious rash, lesion, or ulcer.    LABORATORY PANEL:   CBC  Recent Labs Lab 03/03/15 0559  WBC 10.7*  HGB 15.0  HCT 46.8  PLT 206   ------------------------------------------------------------------------------------------------------------------  Chemistries   Recent Labs Lab 03/05/15 0628  NA 138  K 4.5  CL 98*  CO2 33*  GLUCOSE 159*  BUN 23*  CREATININE 1.61*  CALCIUM 8.5*   ------------------------------------------------------------------------------------------------------------------  Cardiac Enzymes  Recent Labs Lab 03/02/15 1642  TROPONINI 0.05*   ------------------------------------------------------------------------------------------------------------------  RADIOLOGY:  Dg Chest 1 View  03/05/2015  CLINICAL DATA:  Acute onset of shortness of breath. Initial encounter. EXAM: CHEST 1 VIEW COMPARISON:  Chest radiograph performed 03/02/2015 FINDINGS: The lungs are well-aerated. Vascular congestion is noted. Hazy bilateral airspace opacity raises concern for pulmonary edema. No definite pleural effusion or pneumothorax is seen. The cardiomediastinal silhouette is enlarged. The patient is status post median sternotomy. An aortic valve replacement is noted. No acute osseous abnormalities are seen. IMPRESSION: Vascular congestion and cardiomegaly. Hazy bilateral airspace opacity raises concern for pulmonary edema. Electronically Signed   By: Roanna Raider M.D.   On: 03/05/2015 06:28   Nm Pulmonary Perf And Vent  03/04/2015  CLINICAL DATA:  Bilateral pneumonia.  Hypoxia. EXAM: NUCLEAR MEDICINE VENTILATION - PERFUSION LUNG SCAN TECHNIQUE: Ventilation images were obtained in multiple  projections using inhaled aerosol Tc-75m DTPA. Perfusion images were obtained in multiple projections after intravenous  injection of Tc-4560m MAA. RADIOPHARMACEUTICALS:  32.6 Technetium-7360m DTPA aerosol inhalation and 3.9 Technetium-460m MAA IV COMPARISON:  03/02/2015 FINDINGS: Ventilation: No focal ventilation defect. Perfusion: No wedge shaped peripheral perfusion defects to suggest acute pulmonary embolism. IMPRESSION: 1. Normal ventilation perfusion lung scan without perfusion defect to suggest embolism. Electronically Signed   By: Gaylyn RongWalter  Liebkemann M.D.   On: 03/04/2015 14:11     ASSESSMENT AND PLAN:   60 year old male with past medical history of diabetes type 2 without complication, hypertension, previous history of pneumonia presents to the hospital at shortness of breath and cough and noted to be in acute respiratory failure.  #1 acute respiratory failure with hypoxia-I suspect this most likely CHF with possible underlying pneumonia. -Patient has significant hypoxemia on minimal exertion. V/Q Scan (-) for PE. CXR  This a.m. showing pulmonary edema. -Continue IV Levaquin, O2 supplementation, I will increased Lasix and follow.  -Patient will likely need oxygen upon discharge.  #2 CHF-acute on chronic diastolic dysfunction. -Echo results still pending. Continue diuresis with IV Lasix but will advance dose, continue metoprolol, lisinopril. Consider Cardiology consult if not improving.  -Follow I's and O's and daily weights.  #3 pneumonia-likely community-acquired. -d/c IV Ceftriaxone/Zithromax and switched to Doxycycline.  Cultures so far negative.  #4 essential hypertension-continue Norvasc, lisinopril, metoprolol.  #5 diabetes type 2 without complication-continue Levemir, sliding scale insulin. Blood sugar stable.  #6 hyperlipidemia-continue atorvastatin.  #7 chronic kidney disease stage III-baseline creatinine of 1.5. Watch was diuresis.   All the records are reviewed and case  discussed with Care Management/Social Workerr. Management plans discussed with the patient, family and they are in agreement.  CODE STATUS: Full  DVT Prophylaxis: Heparin SQ  TOTAL TIME TAKING CARE OF THIS PATIENT: 30 minutes.   POSSIBLE D/C IN 1-2 DAYS, DEPENDING ON CLINICAL CONDITION.   [place] SirenSAINANI,Anneka Studer J M.D on 03/05/2015 at 3:52 PM  Between 7am to 6pm - Pager - [phone]  After 6pm go to [email or web address] - password [email or web address] [email or web address]  [place] [place] [facility]  Office  [phone]  CC: Primary care physician; Luna FuseEJAN-SIE, SHEIKH AHMED, MD

## 2015-03-05 NOTE — Progress Notes (Signed)
Resting O2  - 82% room air

## 2015-03-05 NOTE — Progress Notes (Signed)
PHARMACIST - PHYSICIAN COMMUNICATION DR:   Sainani CONCERNING: Antibiotic IV to Oral Route Change Policy  RECOMMENDATION: This patient is receiving azithromycin by the intravenous route.  Based on criteria approved by the Pharmacy and Therapeutics Committee, the antibiotic(s) is/are being converted to the equivalent oral dose form(s).   DESCRIPTION: These criteria include:  Patient being treated for a respiratory tract infection, urinary tract infection, cellulitis or clostridium difficile associated diarrhea if on metronidazole  The patient is not neutropenic and does not exhibit a GI malabsorption state  The patient is eating (either orally or via tube) and/or has been taking other orally administered medications for a least 24 hours  The patient is improving clinically and has a Tmax < 100.5  If you have questions about this conversion, please contact the Pharmacy Department  []  ( [phone] )  [place] [x]  ( [phone] )   [facility] []  ( [phone] )  [place] []  ( [phone] )  [facility] []  ( [phone] )  [facility]   

## 2015-03-05 NOTE — Progress Notes (Signed)
SATURATION QUALIFICATIONS:  Patient Saturations on Room Air at Rest = 82%   Please briefly explain why patient needs home oxygen: Chronic heart failure

## 2015-03-06 LAB — GLUCOSE, CAPILLARY
GLUCOSE-CAPILLARY: 151 mg/dL — AB (ref 65–99)
GLUCOSE-CAPILLARY: 226 mg/dL — AB (ref 65–99)
Glucose-Capillary: 162 mg/dL — ABNORMAL HIGH (ref 65–99)
Glucose-Capillary: 185 mg/dL — ABNORMAL HIGH (ref 65–99)
Glucose-Capillary: 262 mg/dL — ABNORMAL HIGH (ref 65–99)

## 2015-03-06 LAB — CULTURE, RESPIRATORY W GRAM STAIN: Culture: NORMAL

## 2015-03-06 LAB — CULTURE, RESPIRATORY

## 2015-03-06 MED ORDER — FUROSEMIDE 10 MG/ML IJ SOLN
80.0000 mg | Freq: Two times a day (BID) | INTRAMUSCULAR | Status: DC
Start: 2015-03-06 — End: 2015-03-08
  Administered 2015-03-06 – 2015-03-08 (×4): 80 mg via INTRAVENOUS
  Filled 2015-03-06 (×4): qty 8

## 2015-03-06 NOTE — Progress Notes (Signed)
Patient falls asleep during conversation and is using accessory abdominal muscles to breathe, however, oxygen saturations are within normal limits on 3 liters.

## 2015-03-06 NOTE — Progress Notes (Signed)
Oxygen saturations checked while patient was asleep and he averaged between 92-96% on 2-3 liters.  Patient appears to not have regular breathing pattern while sleeping. Patient states he has a history of OSA but does not use CPAP.

## 2015-03-06 NOTE — Progress Notes (Signed)
Patient ID: [name], male   DOB: [birth year], 60 y.o.   MRN: [id number] [facility] PROGRESS NOTE  [name]:[id number] DOB: July 28, 1955 DOA: 03/02/2015 PCP: Luna Fuse, MD  HPI/Subjective: Patient feeling better today than yesterday. He's has been urinating all night.  Objective: Filed Vitals:   03/06/15 0439 03/06/15 0809  BP: 136/71 139/77  Pulse: 77 69  Temp: 98.2 F (36.8 C) 98.2 F (36.8 C)  Resp: 18 18    Filed Weights   03/04/15 0500 03/05/15 1300 03/06/15 0439  Weight: 120.2 kg (264 lb 15.9 oz) 120.203 kg (265 lb) 121.156 kg (267 lb 1.6 oz)    ROS: Review of Systems  Constitutional: Negative for fever and chills.  Eyes: Negative for blurred vision.  Respiratory: Positive for cough and shortness of breath.   Cardiovascular: Negative for chest pain.  Gastrointestinal: Negative for nausea, vomiting, abdominal pain, diarrhea and constipation.  Genitourinary: Negative for dysuria.  Musculoskeletal: Negative for joint pain.  Neurological: Negative for dizziness and headaches.   Exam: Physical Exam  Constitutional: He is oriented to person, place, and time.  HENT:  Nose: No mucosal edema.  Mouth/Throat: No oropharyngeal exudate or posterior oropharyngeal edema.  Eyes: Conjunctivae, EOM and lids are normal. Pupils are equal, round, and reactive to light.  Neck: No JVD present. Carotid bruit is not present. No edema present. No thyroid mass and no thyromegaly present.  Cardiovascular: S1 normal and S2 normal.  Exam reveals no gallop.   No murmur heard. Pulses:      Dorsalis pedis pulses are 2+ on the right side, and 2+ on the left side.  Respiratory: No respiratory distress. He has decreased breath sounds in the right middle field, the right lower field, the left middle field and the left lower field. He has no wheezes. He has no rhonchi. He has rales in the right lower field and the left lower field.  GI: Soft. Bowel sounds are  normal. There is no tenderness.  Musculoskeletal:       Right ankle: He exhibits swelling.       Left ankle: He exhibits swelling.  Lymphadenopathy:    He has no cervical adenopathy.  Neurological: He is alert and oriented to person, place, and time. No cranial nerve deficit.  Skin: Skin is warm. No rash noted. Nails show no clubbing.  Psychiatric: He has a normal mood and affect.      Data Reviewed: Basic Metabolic Panel:  Recent Labs Lab 03/02/15 1642 03/03/15 0559 03/04/15 0702 03/05/15 0628  NA 139 140 139 138  K 4.0 4.5 4.6 4.5  CL 102 102 101 98*  CO2 27 32 33* 33*  GLUCOSE 98 112* 84 159*  BUN 23*  CREATININE 1.83* 1.68* 1.72* 1.61*  CALCIUM 8.3* 8.4* 8.7* 8.5*   CBC:  Recent Labs Lab 03/02/15 1642 03/03/15 0559  WBC 12.2* 10.7*  NEUTROABS 9.5*  --   HGB 15.5 15.0  HCT 49.0 46.8  MCV 80.3 80.9  PLT 202 206   Cardiac Enzymes:  Recent Labs Lab 03/02/15 1642  TROPONINI 0.05*   BNP (last 3 results)  Recent Labs  03/02/15 1642  BNP 154.0*    CBG:  Recent Labs Lab 03/05/15 1118 03/05/15 1630 03/05/15 2034 03/06/15 0401 03/06/15 0810  GLUCAP 153* 155* 185* 162* 185*    Recent Results (from the past 240 hour(s))  Blood culture (routine x 2)     Status: None (Preliminary result)  Collection Time: 03/02/15  4:42 PM  Result Value Ref Range Status   Specimen Description BLOOD LEFT HAND  Final   Special Requests   Final    BOTTLES DRAWN AEROBIC AND ANAEROBIC  AERO 6CC ANA 1CC   Culture NO GROWTH 4 DAYS  Final   Report Status PENDING  Incomplete  Blood culture (routine x 2)     Status: None (Preliminary result)   Collection Time: 03/02/15  4:42 PM  Result Value Ref Range Status   Specimen Description BLOOD RIGHT ASSIST CONTROL  Final   Special Requests BOTTLES DRAWN AEROBIC AND ANAEROBIC  2CC  Final   Culture NO GROWTH 4 DAYS  Final   Report Status PENDING  Incomplete  Rapid Influenza A&B Antigens ([facility] only)     Status:  Abnormal   Collection Time: 03/02/15  6:03 PM  Result Value Ref Range Status   Influenza A ([facility]) NOT DETECTED (A) NEGATIVE Final   Influenza B ([facility]) NOT DETECTED (A) NEGATIVE Final  Urine culture     Status: None   Collection Time: 03/02/15 10:29 PM  Result Value Ref Range Status   Specimen Description URINE, RANDOM  Final   Special Requests NONE  Final   Culture NO GROWTH 2 DAYS  Final   Report Status 03/04/2015 FINAL  Final  Culture, sputum-assessment     Status: None   Collection Time: 03/03/15  8:35 AM  Result Value Ref Range Status   Specimen Description EXPECTORATED SPUTUM  Final   Special Requests NONE  Final   Sputum evaluation THIS SPECIMEN IS ACCEPTABLE FOR SPUTUM CULTURE  Final   Report Status 03/03/2015 FINAL  Final  Culture, respiratory (NON-Expectorated)     Status: None   Collection Time: 03/03/15  8:35 AM  Result Value Ref Range Status   Specimen Description EXPECTORATED SPUTUM  Final   Special Requests NONE Reflexed from [id number]  Final   Gram Stain   Final    FEW WBC SEEN RARE SQUAMOUS EPITHELIAL CELLS PRESENT FEW GRAM POSITIVE COCCI    Culture Consistent with normal respiratory flora.  Final   Report Status 03/06/2015 FINAL  Final     Studies: Dg Chest 1 View  03/05/2015  CLINICAL DATA:  Acute onset of shortness of breath. Initial encounter. EXAM: CHEST 1 VIEW COMPARISON:  Chest radiograph performed 03/02/2015 FINDINGS: The lungs are well-aerated. Vascular congestion is noted. Hazy bilateral airspace opacity raises concern for pulmonary edema. No definite pleural effusion or pneumothorax is seen. The cardiomediastinal silhouette is enlarged. The patient is status post median sternotomy. An aortic valve replacement is noted. No acute osseous abnormalities are seen. IMPRESSION: Vascular congestion and cardiomegaly. Hazy bilateral airspace opacity raises concern for pulmonary edema. Electronically Signed   By: Roanna RaiderJeffery  Chang M.D.   On: 03/05/2015 06:28   Nm  Pulmonary Perf And Vent  03/04/2015  CLINICAL DATA:  Bilateral pneumonia.  Hypoxia. EXAM: NUCLEAR MEDICINE VENTILATION - PERFUSION LUNG SCAN TECHNIQUE: Ventilation images were obtained in multiple projections using inhaled aerosol Tc-1428m DTPA. Perfusion images were obtained in multiple projections after intravenous injection of Tc-7628m MAA. RADIOPHARMACEUTICALS:  32.6 Technetium-5628m DTPA aerosol inhalation and 3.9 Technetium-7528m MAA IV COMPARISON:  03/02/2015 FINDINGS: Ventilation: No focal ventilation defect. Perfusion: No wedge shaped peripheral perfusion defects to suggest acute pulmonary embolism. IMPRESSION: 1. Normal ventilation perfusion lung scan without perfusion defect to suggest embolism. Electronically Signed   By: Gaylyn RongWalter  Liebkemann M.D.   On: 03/04/2015 14:11    Scheduled Meds: . amLODipine  10  mg Oral Daily  . antiseptic oral rinse  7 mL Mouth Rinse BID  . aspirin EC  81 mg Oral Daily  . atorvastatin  20 mg Oral QHS  . doxycycline  100 mg Oral Q12H  . furosemide  80 mg Intravenous BID  . heparin  5,000 Units Subcutaneous 3 times per day  . insulin aspart  0-5 Units Subcutaneous QHS  . insulin aspart  0-9 Units Subcutaneous TID WC  . insulin detemir  55 Units Subcutaneous BID  . lisinopril  5 mg Oral Daily  . metoprolol  50 mg Oral BID    Assessment/Plan:  1. Acute respiratory failure with hypoxia. I dialed down his oxygen from 6 L down to 4 L. I asked the nurse check pulse ox on that. 2. Acute on chronic systolic congestive heart failure. Looking back at echo report from previous EF was 35%. Still awaiting echocardiogram report from today. Change Lasix to 80 mg IV twice a day. Spoke with Dr. Welton Flakes cardiology to see the patient in consultation. 3. Possible pneumonia- switched to doxycycline orally 4. Essential hypertension- patient on Norvasc lisinopril metoprolol 5. Type 2 diabetes mellitus continue Levemir and sliding scale 6. Chronic kidney disease stage III- watch closely  with diuresis 7. Hyperlipidemia unspecified continue atorvastatin  Code Status:     Code Status Orders        Start     Ordered   03/02/15 1949  Full code   Continuous     03/02/15 1948    Code Status History    Date Active Date Inactive Code Status Order ID Comments User Context   This patient has a current code status but no historical code status.     Disposition Plan: Patient needs to move better air prior to going home  Consultants:  Cardiology  Antibiotics:  Doxycycline  Time spent: 25 minutes  Alford Highland  [facility]

## 2015-03-06 NOTE — Progress Notes (Cosign Needed)
Patient had echo done 03/03/15, but as merge not working results are not going in epic. LVEF was 65 %, mild diastolic dysfunction, mild MR, trace AR and mild TR .

## 2015-03-07 LAB — CULTURE, BLOOD (ROUTINE X 2)
CULTURE: NO GROWTH
CULTURE: NO GROWTH

## 2015-03-07 LAB — BASIC METABOLIC PANEL
Anion gap: 7 (ref 5–15)
BUN: 27 mg/dL — ABNORMAL HIGH (ref 6–20)
CHLORIDE: 92 mmol/L — AB (ref 101–111)
CO2: 37 mmol/L — AB (ref 22–32)
Calcium: 8.6 mg/dL — ABNORMAL LOW (ref 8.9–10.3)
Creatinine, Ser: 1.59 mg/dL — ABNORMAL HIGH (ref 0.61–1.24)
GFR calc non Af Amer: 46 mL/min — ABNORMAL LOW (ref >60)
GFR, EST AFRICAN AMERICAN: 53 mL/min — AB (ref >60)
Glucose, Bld: 273 mg/dL — ABNORMAL HIGH (ref 65–99)
POTASSIUM: 4.1 mmol/L (ref 3.5–5.1)
SODIUM: 136 mmol/L (ref 135–145)

## 2015-03-07 LAB — GLUCOSE, CAPILLARY
GLUCOSE-CAPILLARY: 178 mg/dL — AB (ref 65–99)
GLUCOSE-CAPILLARY: 244 mg/dL — AB (ref 65–99)
GLUCOSE-CAPILLARY: 170 mg/dL — AB (ref 65–99)
Glucose-Capillary: 152 mg/dL — ABNORMAL HIGH (ref 65–99)

## 2015-03-07 LAB — CBC
HEMATOCRIT: 46.1 % (ref 40.0–52.0)
Hemoglobin: 14.7 g/dL (ref 13.0–18.0)
MCH: 25.7 pg — AB (ref 26.0–34.0)
MCHC: 31.8 g/dL — ABNORMAL LOW (ref 32.0–36.0)
MCV: 80.8 fL (ref 80.0–100.0)
Platelets: 188 10*3/uL (ref 150–440)
RBC: 5.7 MIL/uL (ref 4.40–5.90)
RDW: 15 % — ABNORMAL HIGH (ref 11.5–14.5)
WBC: 9.9 10*3/uL (ref 3.8–10.6)

## 2015-03-07 NOTE — Progress Notes (Signed)
[name] is a 60 y.o. male  [id number]  Primary Cardiologist: Adrian BlackwaterShaukat Khan Reason for Consultation: Shortness of breath and CHF  HPI: This is a 60 year old pleasant male presented to the emergency room with severe shortness of breath orthopnea and PND and was found to be hypoxic. He denied chest pain. At this time he is walking around without oxygen without any shortness of breath. He states that he has history of sleep apnea which was diagnosed in our office however has not been wearing his CPAP for the past couple years.   Review of Systems: No orthopnea PND or leg swelling   Past Medical History  Diagnosis Date  . Diabetes mellitus without complication (HCC)   . Hypertension   . Pneumonia   . CKD (chronic kidney disease), stage III   . Obesity   . Hyperlipidemia     Medications Prior to Admission  Medication Sig Dispense Refill  . amLODipine (NORVASC) 10 MG tablet Take 10 mg by mouth daily.    Marland Kitchen. aspirin EC 81 MG tablet Take 81 mg by mouth daily.    Marland Kitchen. atorvastatin (LIPITOR) 20 MG tablet Take 20 mg by mouth at bedtime.    . canagliflozin (INVOKANA) 300 MG TABS tablet Take 300 mg by mouth daily before breakfast.    . insulin detemir (LEVEMIR) 100 UNIT/ML injection Inject 55 Units into the skin 2 (two) times daily.    Marland Kitchen. lisinopril (PRINIVIL,ZESTRIL) 5 MG tablet Take 5 mg by mouth daily.    . metoprolol (LOPRESSOR) 50 MG tablet Take 50 mg by mouth 2 (two) times daily.    . saxagliptin HCl (ONGLYZA) 2.5 MG TABS tablet Take 5 mg by mouth daily.    . cephALEXin (KEFLEX) 500 MG capsule Take 1 capsule (500 mg total) by mouth 4 (four) times daily. (Patient not taking: Reported on 03/02/2015) 28 capsule 0     . amLODipine  10 mg Oral Daily  . antiseptic oral rinse  7 mL Mouth Rinse BID  . aspirin EC  81 mg Oral Daily  . atorvastatin  20 mg Oral QHS  . doxycycline  100 mg Oral Q12H  . furosemide  80 mg Intravenous BID  . heparin  5,000 Units Subcutaneous 3 times per day  .  insulin aspart  0-5 Units Subcutaneous QHS  . insulin aspart  0-9 Units Subcutaneous TID WC  . insulin detemir  55 Units Subcutaneous BID  . lisinopril  5 mg Oral Daily  . metoprolol  50 mg Oral BID    Infusions:    No Known Allergies  Social History   Social History  . Marital Status: Single    Spouse Name: N/A  . Number of Children: N/A  . Years of Education: N/A   Occupational History  . Not on file.   Social History Main Topics  . Smoking status: Never Smoker   . Smokeless tobacco: Not on file  . Alcohol Use: No  . Drug Use: Not on file  . Sexual Activity: Not on file   Other Topics Concern  . Not on file   Social History Narrative    History reviewed. No pertinent family history.  PHYSICAL EXAM: Filed Vitals:   03/07/15 0454 03/07/15 0840  BP: 148/72 137/77  Pulse: 75 84  Temp: 98.5 F (36.9 C) 98.4 F (36.9 C)  Resp:  20     Intake/Output Summary (Last 24 hours) at 03/07/15 1048 Last data filed at 03/07/15 0737  Gross per  24 hour  Intake    720 ml  Output   1500 ml  Net   -780 ml    General:  Well appearing. No respiratory difficulty HEENT: normal Neck: supple. no JVD. Carotids 2+ bilat; no bruits. No lymphadenopathy or thryomegaly appreciated. Cor: PMI nondisplaced. Regular rate & rhythm. No rubs, gallops or murmurs. Lungs: clear Abdomen: soft, nontender, nondistended. No hepatosplenomegaly. No bruits or masses. Good bowel sounds. Extremities: no cyanosis, clubbing, rash, edema Neuro: alert & oriented x 3, cranial nerves grossly intact. moves all 4 extremities w/o difficulty. Affect pleasant.  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  Results for orders placed or performed during the hospital encounter of 03/02/15 (from the past 24 hour(s))  Glucose, capillary     Status:  Abnormal   Collection Time: 03/06/15 11:15 AM  Result Value Ref Range   Glucose-Capillary 151 (H) 65 - 99 mg/dL   Comment 1 Notify RN   Glucose, capillary     Status: Abnormal   Collection Time: 03/06/15  3:58 PM  Result Value Ref Range   Glucose-Capillary 262 (H) 65 - 99 mg/dL   Comment 1 Notify RN   Glucose, capillary     Status: Abnormal   Collection Time: 03/06/15  8:52 PM  Result Value Ref Range   Glucose-Capillary 226 (H) 65 - 99 mg/dL  Basic metabolic panel     Status: Abnormal   Collection Time: 03/07/15  4:34 AM  Result Value Ref Range   Sodium 136 135 - 145 mmol/L   Potassium 4.1 3.5 - 5.1 mmol/L   Chloride 92 (L) 101 - 111 mmol/L   CO2 37 (H) 22 - 32 mmol/L   Glucose, Bld 273 (H) 65 - 99 mg/dL   BUN 27 (H) 6 - 20 mg/dL   Creatinine, Ser 4.09 (H) 0.61 - 1.24 mg/dL   Calcium 8.6 (L) 8.9 - 10.3 mg/dL   GFR calc non Af Amer 46 (L) >60 mL/min   GFR calc Af Amer 53 (L) >60 mL/min   Anion gap 7 5 - 15  CBC     Status: Abnormal   Collection Time: 03/07/15  4:34 AM  Result Value Ref Range   WBC 9.9 3.8 - 10.6 K/uL   RBC 5.70 4.40 - 5.90 MIL/uL   Hemoglobin 14.7 13.0 - 18.0 g/dL   HCT 81.1 91.4 - 78.2 %   MCV 80.8 80.0 - 100.0 fL   MCH 25.7 (L) 26.0 - 34.0 pg   MCHC 31.8 (L) 32.0 - 36.0 g/dL   RDW 95.6 (H) 21.3 - 08.6 %   Platelets 188 150 - 440 K/uL  Glucose, capillary     Status: Abnormal   Collection Time: 03/07/15  8:07 AM  Result Value Ref Range   Glucose-Capillary 152 (H) 65 - 99 mg/dL   Comment 1 Notify RN    No results found.   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 Patient however is feeling much better and can be discharged with follow-up in the office on Thursday at 3:00 and then will do echocardiogram and nuclear stress test as well as repeat his sleep study.  KHAN,SHAUKAT A

## 2015-03-07 NOTE — Progress Notes (Signed)
Patient ID: [name], male   DOB: 04-29-1955, 60 y.o.   MRN: [id number] [facility] PROGRESS NOTE  JAGO CARTON XBM:[id number] DOB: 07-Apr-1955 DOA: 03/02/2015 PCP: Luna Fuse, MD  HPI/Subjective: Patient feels a little bit better. He is urinating well. He is breathing a little bit better. Still gets short of breath when he is doing something. Some cough.  Objective: Filed Vitals:   03/07/15 0454 03/07/15 0840  BP: 148/72 137/77  Pulse: 75 84  Temp: 98.5 F (36.9 C) 98.4 F (36.9 C)  Resp:  20    Filed Weights   03/05/15 1300 03/06/15 0439 03/07/15 0457  Weight: 120.203 kg (265 lb) 121.156 kg (267 lb 1.6 oz) 119.614 kg (263 lb 11.2 oz)    ROS: Review of Systems  Constitutional: Negative for fever and chills.  Eyes: Negative for blurred vision.  Respiratory: Positive for cough and shortness of breath.   Cardiovascular: Negative for chest pain.  Gastrointestinal: Negative for nausea, vomiting, abdominal pain, diarrhea and constipation.  Genitourinary: Negative for dysuria.  Musculoskeletal: Negative for joint pain.  Neurological: Negative for dizziness and headaches.   Exam: Physical Exam  Constitutional: He is oriented to person, place, and time.  HENT:  Nose: No mucosal edema.  Mouth/Throat: No oropharyngeal exudate or posterior oropharyngeal edema.  Eyes: Conjunctivae, EOM and lids are normal. Pupils are equal, round, and reactive to light.  Neck: No JVD present. Carotid bruit is not present. No edema present. No thyroid mass and no thyromegaly present.  Cardiovascular: S1 normal and S2 normal.  Exam reveals no gallop.   No murmur heard. Pulses:      Dorsalis pedis pulses are 2+ on the right side, and 2+ on the left side.  Respiratory: No respiratory distress. He has decreased breath sounds in the right lower field and the left lower field. He has no wheezes. He has no rhonchi. He has no rales.  GI: Soft. Bowel sounds are normal. There is  no tenderness.  Musculoskeletal:       Right ankle: He exhibits swelling.       Left ankle: He exhibits swelling.  Lymphadenopathy:    He has no cervical adenopathy.  Neurological: He is alert and oriented to person, place, and time. No cranial nerve deficit.  Skin: Skin is warm. No rash noted. Nails show no clubbing.  Psychiatric: He has a normal mood and affect.      Data Reviewed: Basic Metabolic Panel:  Recent Labs Lab 03/02/15 1642 03/03/15 0559 03/04/15 0702 03/05/15 0628 03/07/15 0434  NA 139 140 139 138 136  K 4.0 4.5 4.6 4.5 4.1  CL 102 102 101 98* 92*  CO2 27 32 33* 33* 37*  GLUCOSE 98 112* 84 159* 273*  BUN 23* 27*  CREATININE 1.83* 1.68* 1.72* 1.61* 1.59*  CALCIUM 8.3* 8.4* 8.7* 8.5* 8.6*   CBC:  Recent Labs Lab 03/02/15 1642 03/03/15 0559 03/07/15 0434  WBC 12.2* 10.7* 9.9  NEUTROABS 9.5*  --   --   HGB 15.5 15.0 14.7  HCT 49.0 46.8 46.1  MCV 80.3 80.9 80.8  PLT 202 206 188   Cardiac Enzymes:  Recent Labs Lab 03/02/15 1642  TROPONINI 0.05*   BNP (last 3 results)  Recent Labs  03/02/15 1642  BNP 154.0*    CBG:  Recent Labs Lab 03/06/15 1115 03/06/15 1558 03/06/15 2052 03/07/15 0807 03/07/15 1111  GLUCAP 151* 262* 226* 152* 178*    Recent Results (from  the past 240 hour(s))  Blood culture (routine x 2)     Status: None   Collection Time: 03/02/15  4:42 PM  Result Value Ref Range Status   Specimen Description BLOOD LEFT HAND  Final   Special Requests   Final    BOTTLES DRAWN AEROBIC AND ANAEROBIC  AERO 6CC ANA 1CC   Culture NO GROWTH 5 DAYS  Final   Report Status 03/07/2015 FINAL  Final  Blood culture (routine x 2)     Status: None   Collection Time: 03/02/15  4:42 PM  Result Value Ref Range Status   Specimen Description BLOOD RIGHT ASSIST CONTROL  Final   Special Requests BOTTLES DRAWN AEROBIC AND ANAEROBIC  2CC  Final   Culture NO GROWTH 5 DAYS  Final   Report Status 03/07/2015 FINAL  Final  Rapid Influenza  A&B Antigens ([facility] only)     Status: Abnormal   Collection Time: 03/02/15  6:03 PM  Result Value Ref Range Status   Influenza A ([facility]) NOT DETECTED (A) NEGATIVE Final   Influenza B ([facility]) NOT DETECTED (A) NEGATIVE Final  Urine culture     Status: None   Collection Time: 03/02/15 10:29 PM  Result Value Ref Range Status   Specimen Description URINE, RANDOM  Final   Special Requests NONE  Final   Culture NO GROWTH 2 DAYS  Final   Report Status 03/04/2015 FINAL  Final  Culture, sputum-assessment     Status: None   Collection Time: 03/03/15  8:35 AM  Result Value Ref Range Status   Specimen Description EXPECTORATED SPUTUM  Final   Special Requests NONE  Final   Sputum evaluation THIS SPECIMEN IS ACCEPTABLE FOR SPUTUM CULTURE  Final   Report Status 03/03/2015 FINAL  Final  Culture, respiratory (NON-Expectorated)     Status: None   Collection Time: 03/03/15  8:35 AM  Result Value Ref Range Status   Specimen Description EXPECTORATED SPUTUM  Final   Special Requests NONE Reflexed from [id number]  Final   Gram Stain   Final    FEW WBC SEEN RARE SQUAMOUS EPITHELIAL CELLS PRESENT FEW GRAM POSITIVE COCCI    Culture Consistent with normal respiratory flora.  Final   Report Status 03/06/2015 FINAL  Final     Scheduled Meds: . amLODipine  10 mg Oral Daily  . antiseptic oral rinse  7 mL Mouth Rinse BID  . aspirin EC  81 mg Oral Daily  . atorvastatin  20 mg Oral QHS  . doxycycline  100 mg Oral Q12H  . furosemide  80 mg Intravenous BID  . heparin  5,000 Units Subcutaneous 3 times per day  . insulin aspart  0-5 Units Subcutaneous QHS  . insulin aspart  0-9 Units Subcutaneous TID WC  . insulin detemir  55 Units Subcutaneous BID  . lisinopril  5 mg Oral Daily  . metoprolol  50 mg Oral BID    Assessment/Plan:  1. Acute respiratory failure with hypoxia. I dialed down his oxygen from 6 L down to 2 L. his pulse ox has been varying greatly during the hospital course. Since the patient came in  without wearing oxygen, I will try to get him home without wearing oxygen. Check pulse ox on room air in a.m. If I can't get him off the oxygen, I will get a CT scan of the chest without oxygen tomorrow. 2. Acute on chronic diastolic congestive heart failure. EF 65%. Continue Lasix to 80 mg IV twice a day. Spoke with  Dr. Welton Flakes cardiology. He will follow-up with the patient as outpatient. 3. Possible pneumonia- switched to doxycycline orally 4. Essential hypertension- patient on Norvasc lisinopril metoprolol 5. Type 2 diabetes mellitus continue Levemir and sliding scale 6. Chronic kidney disease stage III- watch closely with diuresis 7. Hyperlipidemia unspecified continue atorvastatin  Code Status:     Code Status Orders        Start     Ordered   03/02/15 1949  Full code   Continuous     03/02/15 1948    Code Status History    Date Active Date Inactive Code Status Order ID Comments User Context   This patient has a current code status but no historical code status.     Disposition Plan: Home soon  Consultants:  Cardiology  Antibiotics:  Doxycycline  Time spent: 25 minutes  Alford Highland  [facility]

## 2015-03-08 ENCOUNTER — Inpatient Hospital Stay: Payer: BLUE CROSS/BLUE SHIELD

## 2015-03-08 ENCOUNTER — Encounter: Payer: Self-pay | Admitting: Pulmonary Disease

## 2015-03-08 LAB — GLUCOSE, CAPILLARY
GLUCOSE-CAPILLARY: 196 mg/dL — AB (ref 65–99)
GLUCOSE-CAPILLARY: 197 mg/dL — AB (ref 65–99)

## 2015-03-08 LAB — BASIC METABOLIC PANEL
ANION GAP: 10 (ref 5–15)
BUN: 23 mg/dL — AB (ref 6–20)
CALCIUM: 8.6 mg/dL — AB (ref 8.9–10.3)
CO2: 37 mmol/L — ABNORMAL HIGH (ref 22–32)
Chloride: 91 mmol/L — ABNORMAL LOW (ref 101–111)
Creatinine, Ser: 1.48 mg/dL — ABNORMAL HIGH (ref 0.61–1.24)
GFR calc Af Amer: 58 mL/min — ABNORMAL LOW (ref >60)
GFR, EST NON AFRICAN AMERICAN: 50 mL/min — AB (ref >60)
GLUCOSE: 181 mg/dL — AB (ref 65–99)
Potassium: 4.1 mmol/L (ref 3.5–5.1)
SODIUM: 138 mmol/L (ref 135–145)

## 2015-03-08 MED ORDER — PREDNISONE 20 MG PO TABS
20.0000 mg | ORAL_TABLET | Freq: Once | ORAL | Status: AC
  Administered 2015-03-08: 20 mg via ORAL
  Filled 2015-03-08: qty 1

## 2015-03-08 MED ORDER — BUDESONIDE 0.25 MG/2ML IN SUSP: 0.2500 mg | Freq: Two times a day (BID) | RESPIRATORY_TRACT | Status: DC

## 2015-03-08 MED ORDER — IPRATROPIUM-ALBUTEROL 0.5-2.5 (3) MG/3ML IN SOLN
3.0000 mL | Freq: Four times a day (QID) | RESPIRATORY_TRACT | Status: DC
  Administered 2015-03-08: 3 mL via RESPIRATORY_TRACT
  Filled 2015-03-08: qty 3

## 2015-03-08 MED ORDER — IPRATROPIUM-ALBUTEROL 0.5-2.5 (3) MG/3ML IN SOLN: 3.0000 mL | Freq: Four times a day (QID) | RESPIRATORY_TRACT | Status: DC

## 2015-03-08 MED ORDER — ALBUTEROL SULFATE HFA 108 (90 BASE) MCG/ACT IN AERS: 2.0000 | INHALATION_SPRAY | Freq: Four times a day (QID) | RESPIRATORY_TRACT | Status: DC | PRN

## 2015-03-08 MED ORDER — FUROSEMIDE 40 MG PO TABS: 40.0000 mg | ORAL_TABLET | Freq: Two times a day (BID) | ORAL | Status: DC

## 2015-03-08 MED ORDER — FUROSEMIDE 40 MG PO TABS: 60.0000 mg | ORAL_TABLET | Freq: Two times a day (BID) | ORAL | Status: DC

## 2015-03-08 MED ORDER — DOXYCYCLINE HYCLATE 100 MG PO TABS: 100.0000 mg | ORAL_TABLET | Freq: Two times a day (BID) | ORAL | Status: DC

## 2015-03-08 MED ORDER — PREDNISONE 5 MG PO TABS: ORAL_TABLET | ORAL | Status: DC

## 2015-03-08 MED ORDER — FLUTICASONE PROPIONATE HFA 220 MCG/ACT IN AERO: 1.0000 | INHALATION_SPRAY | Freq: Two times a day (BID) | RESPIRATORY_TRACT | Status: DC

## 2015-03-08 NOTE — Discharge Instructions (Signed)
Respiratory failure is when your lungs are not working well and your breathing (respiratory) system fails. When respiratory failure occurs, it is difficult for your lungs to get enough oxygen, get rid of carbon dioxide, or both. Respiratory failure can be life threatening.  °Respiratory failure can be acute or chronic. Acute respiratory failure is sudden, severe, and requires emergency medical treatment. Chronic respiratory failure is less severe, happens over time, and requires ongoing treatment.  °WHAT ARE THE CAUSES OF ACUTE RESPIRATORY FAILURE?  °Any problem affecting the heart or lungs can cause acute respiratory failure. Some of these causes include the following: °· Chronic bronchitis and emphysema (COPD).   °· Blood clot going to a lung (pulmonary embolism).   °· Having water in the lungs caused by heart failure, lung injury, or infection (pulmonary edema).   °· Collapsed lung (pneumothorax).   °· Pneumonia.   °· Pulmonary fibrosis.   °· Obesity.   °· Asthma.   °· Heart failure.   °· Any type of trauma to the chest that can make breathing difficult.   °· Nerve or muscle diseases making chest movements difficult. °HOW WILL MY ACUTE RESPIRATORY FAILURE BE TREATED?  °Treatment of acute respiratory failure depends on the cause of the respiratory failure. Usually, you will stay in the intensive care unit so your breathing can be watched closely. Treatment can include the following: °· Oxygen. Oxygen can be delivered through the following: °¨ Nasal cannula. This is small tubing that goes in your nose to give you oxygen. °¨ Face mask. A face mask covers your nose and mouth to give you oxygen. °· Medicine. Different medicines can be given to help with breathing. These can include: °¨ Nebulizers. Nebulizers deliver medicines to open the air passages (bronchodilators). These medicines help to open or relax the airways in the lungs so you can breathe better. They can also help loosen mucus from your  lungs. °¨ Diuretics. Diuretic medicines can help you breathe better by getting rid of extra water in your body. °¨ Steroids. Steroid medicines can help decrease swelling (inflammation) in your lungs. °¨ Antibiotics. °· Chest tube. If you have a collapsed lung (pneumothorax), a chest tube is placed to help reinflate the lung. °· Noninvasive positive pressure ventilation (NPPV). This is a tight-fitting mask that goes over your nose and mouth. The mask has tubing that is attached to a machine. The machine blows air into the tubing, which helps to keep the tiny air sacs (alveoli) in your lungs open. This machine allows you to breathe on your own. °· Ventilator. A ventilator is a breathing machine. When on a ventilator, a breathing tube is put into the lungs. A ventilator is used when you can no longer breathe well enough on your own. You may have low oxygen levels or high carbon dioxide (CO2) levels in your blood. When you are on a ventilator, sedation and pain medicines are given to make you sleep so your lungs can heal. °SEEK IMMEDIATE MEDICAL CARE IF: °· You have shortness of breath (dyspnea) with or without activity. °· You have rapid breathing (tachypnea). °· You are wheezing. °· You are unable to say more than a few words without having to catch your breath. °· You find it very difficult to function normally. °· You have a fast heart rate. °· You have a bluish color to your finger or toe nail beds. °· You have confusion or drowsiness or both. °  °This information is not intended to replace advice given to you by your health care provider. Make sure you discuss   any questions you have with your health care provider. °  °Document Released: 12/24/2012 Document Revised: 09/09/2014 Document Reviewed: 12/24/2012 °Elsevier Interactive Patient Education ©2016 [employer] ° °

## 2015-03-08 NOTE — Progress Notes (Signed)
SUBJECTIVE: Patient is feeling better   Filed Vitals:   03/08/15 0500 03/08/15 0737 03/08/15 0908 03/08/15 0913  BP:  121/72    Pulse:  74    Temp:  97.9 F (36.6 C)    TempSrc:  Oral    Resp:  20    Height:      Weight: 247 lb 2 oz (112.095 kg)     SpO2:  91% 82% 92%    Intake/Output Summary (Last 24 hours) at 03/08/15 0921 Last data filed at 03/08/15 0400  Gross per 24 hour  Intake   2410 ml  Output   3075 ml  Net   -665 ml    LABS: Basic Metabolic Panel:  Recent Labs  91/47/8201/05/18 0434 03/08/15 0510  NA 136 138  K 4.1 4.1  CL 92* 91*  CO2 37* 37*  GLUCOSE 273* 181*  BUN 27* 23*  CREATININE 1.59* 1.48*  CALCIUM 8.6* 8.6*   Liver Function Tests: No results for input(s): AST, ALT, ALKPHOS, BILITOT, PROT, ALBUMIN in the last 72 hours. No results for input(s): LIPASE, AMYLASE in the last 72 hours. CBC:  Recent Labs  03/07/15 0434  WBC 9.9  HGB 14.7  HCT 46.1  MCV 80.8  PLT 188   Cardiac Enzymes: No results for input(s): CKTOTAL, CKMB, CKMBINDEX, TROPONINI in the last 72 hours. BNP: Invalid input(s): POCBNP D-Dimer: No results for input(s): DDIMER in the last 72 hours. Hemoglobin A1C: No results for input(s): HGBA1C in the last 72 hours. Fasting Lipid Panel: No results for input(s): CHOL, HDL, LDLCALC, TRIG, CHOLHDL, LDLDIRECT in the last 72 hours. Thyroid Function Tests: No results for input(s): TSH, T4TOTAL, T3FREE, THYROIDAB in the last 72 hours.  Invalid input(s): FREET3 Anemia Panel: No results for input(s): VITAMINB12, FOLATE, FERRITIN, TIBC, IRON, RETICCTPCT in the last 72 hours. The top  PHYSICAL EXAM General: Well developed, well nourished, in no acute distress HEENT:  Normocephalic and atramatic Neck:  No JVD.  Lungs: Clear bilaterally to auscultation and percussion. Heart: HRRR . Normal S1 and S2 without gallops or murmurs.  Abdomen: Bowel sounds are positive, abdomen soft and non-tender  Msk:  Back normal, normal gait. Normal  strength and tone for age. Extremities: No clubbing, cyanosis or edema.   Neuro: Alert and oriented X 3. Psych:  Good affect, responds appropriately  TELEMETRY: Sinus rhythm  ASSESSMENT AND PLAN: Patient is feeling much better and may go home with follow-up in the office tomorrow at 3:00 with with his primary care doctor and to 2:30 with me.  Principal Problem:   Pneumonia    Laurier NancyKHAN,Thayne Cindric A, MD, [facility] 03/08/2015 9:21 AM

## 2015-03-08 NOTE — Progress Notes (Signed)
Completed discharge instructions with patient.  Oxygen and nebulizer was delivered to room.

## 2015-03-08 NOTE — Progress Notes (Signed)
SATURATION QUALIFICATIONS: (This note is used to comply with regulatory documentation for home oxygen)  Patient Saturations on Room Air at Rest = 71%  Patient Saturations on Room Air while Ambulating = 71%  Patient Saturations on 2 Liters of oxygen while Ambulating = 90%  Please briefly explain why patient needs home oxygen:  CHF

## 2015-03-08 NOTE — Discharge Summary (Signed)
[facility] [place] at [facility]   PATIENT NAME: [name]    MR#:  [id number]  DATE OF BIRTH:  [birth year]  DATE OF ADMISSION:  03/02/2015 ADMITTING PHYSICIAN: Shaune Pollack, MD  DATE OF DISCHARGE: 03/08/2015  PRIMARY CARE PHYSICIAN: Luna Fuse, MD, patient sees a physicians assistant at [facility].   ADMISSION DIAGNOSIS:  Acute renal insufficiency [N28.9] Hypoxia [R09.02] CAP (community acquired pneumonia) [J18.9] Elevated troponin I level [R79.89]  DISCHARGE DIAGNOSIS:  Principal Problem:   Pneumonia   SECONDARY DIAGNOSIS:   Past Medical History  Diagnosis Date  . Diabetes mellitus without complication (HCC)   . Hypertension   . Pneumonia   . CKD (chronic kidney disease), stage III   . Obesity   . Hyperlipidemia   . CHF (congestive heart failure) (HCC)     HOSPITAL COURSE:   1. Acute respiratory failure with hypoxia. Now chronic respiratory failure. I was unable to get the patient off oxygen despite treatments with nebulizer treatment steroids antibiotics and IV Lasix. Patient's pulse ox was in the 70s on room air. Patient will go home on 2 L nasal cannula. 2. Acute on chronic diastolic congestive heart failure with EF of 65%. Patient will go home on Lasix 40 mg twice a day. He will follow-up with Dr. Welton Flakes cardiology tomorrow as outpatient. He will be set up with a sleep study as outpatient. 3. Pneumonia. Patient was on IV antibiotics initially and switched over to oral doxycycline. I got a CT scan of the chest which showed right upper lobe airspace disease. A mosaic pattern of the lung parenchyma suggestive of obstructive small airway disease. I prescribed nebulizer treatments and he felt better afterwards. I will prescribe nebulizer treatment at home, Flovent inhaler, quick prednisone taper and finish up the course of doxycycline. Patient will need a pulmonologist as outpatient. 4. Essential hypertension continue Norvasc  lisinopril metoprolol 5. Type 2 diabetes mellitus continue Levemir and go back on oral medications as outpatient 6. Chronic kidney disease stage III watch closely as outpatient with diuresis 7. Hyperlipidemia unspecified continue atorvastatin  DISCHARGE CONDITIONS:   Satisfactory  CONSULTS OBTAINED:  Treatment Team:  Laurier Nancy, MD  DRUG ALLERGIES:  No Known Allergies  DISCHARGE MEDICATIONS:   Current Discharge Medication List    START taking these medications   Details  albuterol (PROVENTIL HFA;VENTOLIN HFA) 108 (90 Base) MCG/ACT inhaler Inhale 2 puffs into the lungs every 6 (six) hours as needed for wheezing or shortness of breath. Qty: 1 Inhaler, Refills: 0    doxycycline (VIBRA-TABS) 100 MG tablet Take 1 tablet (100 mg total) by mouth every 12 (twelve) hours. Qty: 10 tablet, Refills: 0    fluticasone (FLOVENT HFA) 220 MCG/ACT inhaler Inhale 1 puff into the lungs 2 (two) times daily. Qty: 1 Inhaler, Refills: 0    furosemide (LASIX) 40 MG tablet Take 1 tablet (40 mg total) by mouth 2 (two) times daily. Qty: 60 tablet, Refills: 0    ipratropium-albuterol (DUONEB) 0.5-2.5 (3) MG/3ML SOLN Take 3 mLs by nebulization every 6 (six) hours. Qty: 360 mL, Refills: 0    predniSONE (DELTASONE) 5 MG tablet Take 4 tabs po day1; 3 tabs po day2; 2 tabs po day3; 1 tabs po day4,5 Qty: 11 tablet, Refills: 0      CONTINUE these medications which have NOT CHANGED   Details  amLODipine (NORVASC) 10 MG tablet Take 10 mg by mouth daily.    aspirin EC 81 MG tablet Take 81 mg by  mouth daily.    atorvastatin (LIPITOR) 20 MG tablet Take 20 mg by mouth at bedtime.    canagliflozin (INVOKANA) 300 MG TABS tablet Take 300 mg by mouth daily before breakfast.    insulin detemir (LEVEMIR) 100 UNIT/ML injection Inject 55 Units into the skin 2 (two) times daily.    lisinopril (PRINIVIL,ZESTRIL) 5 MG tablet Take 5 mg by mouth daily.    metoprolol (LOPRESSOR) 50 MG tablet Take 50 mg by mouth 2  (two) times daily.    saxagliptin HCl (ONGLYZA) 2.5 MG TABS tablet Take 5 mg by mouth daily.      STOP taking these medications     cephALEXin (KEFLEX) 500 MG capsule          DISCHARGE INSTRUCTIONS:   Follow-up with Dr. Welton Flakes cardiology tomorrow Follow-up with PMD one week Will need outpatient pulmonary follow-up for pulmonary function tests.  If you experience worsening of your admission symptoms, develop shortness of breath, life threatening emergency, suicidal or homicidal thoughts you must seek medical attention immediately by calling 911 or calling your MD immediately  if symptoms less severe.  You Must read complete instructions/literature along with all the possible adverse reactions/side effects for all the Medicines you take and that have been prescribed to you. Take any new Medicines after you have completely understood and accept all the possible adverse reactions/side effects.   Please note  You were cared for by a hospitalist during your hospital stay. If you have any questions about your discharge medications or the care you received while you were in the hospital after you are discharged, you can call the unit and asked to speak with the hospitalist on call if the hospitalist that took care of you is not available. Once you are discharged, your primary care physician will handle any further medical issues. Please note that NO REFILLS for any discharge medications will be authorized once you are discharged, as it is imperative that you return to your primary care physician (or establish a relationship with a primary care physician if you do not have one) for your aftercare needs so that they can reassess your need for medications and monitor your lab values.    Today   CHIEF COMPLAINT:   Chief Complaint  Patient presents with  . Cough  . Fever    HISTORY OF PRESENT ILLNESS:  [name]  is a 60 y.o. male presented with cough and fever found to have  pneumonia   VITAL SIGNS:  Blood pressure 117/62, pulse 67, temperature 97.9 F (36.6 C), temperature source Oral, resp. rate 16, height  (1.702 m), weight 112.095 kg (247 lb 2 oz), SpO2 95 %.    PHYSICAL EXAMINATION:  GENERAL:  60 y.o.-year-old patient lying in the bed with no acute distress.  EYES: Pupils equal, round, reactive to light and accommodation. No scleral icterus. Extraocular muscles intact.  HEENT: Head atraumatic, normocephalic. Oropharynx and nasopharynx clear.  NECK:  Supple, no jugular venous distention. No thyroid enlargement, no tenderness.  LUNGS: Decreased breath sounds bilaterally, no wheezing, rales,rhonchi or crepitation. No use of accessory muscles of respiration.  CARDIOVASCULAR: S1, S2 normal. No murmurs, rubs, or gallops.  ABDOMEN: Soft, non-tender, non-distended. Bowel sounds present. No organomegaly or mass.  EXTREMITIES: Trace edema, no cyanosis, or clubbing.  NEUROLOGIC: Cranial nerves II through XII are intact. Muscle strength 5/5 in all extremities. Sensation intact. Gait not checked.  PSYCHIATRIC: The patient is alert and oriented x 3.  SKIN: No obvious rash, lesion,  or ulcer.   DATA REVIEW:   CBC  Recent Labs Lab 03/07/15 0434  WBC 9.9  HGB 14.7  HCT 46.1  PLT 188    Chemistries   Recent Labs Lab 03/08/15 0510  NA 138  K 4.1  CL 91*  CO2 37*  GLUCOSE 181*  BUN 23*  CREATININE 1.48*  CALCIUM 8.6*    Cardiac Enzymes  Recent Labs Lab 03/02/15 1642  TROPONINI 0.05*    Microbiology Results  Results for orders placed or performed during the hospital encounter of 03/02/15  Blood culture (routine x 2)     Status: None   Collection Time: 03/02/15  4:42 PM  Result Value Ref Range Status   Specimen Description BLOOD LEFT HAND  Final   Special Requests   Final    BOTTLES DRAWN AEROBIC AND ANAEROBIC  AERO 6CC ANA 1CC   Culture NO GROWTH 5 DAYS  Final   Report Status 03/07/2015 FINAL  Final  Blood culture (routine x 2)      Status: None   Collection Time: 03/02/15  4:42 PM  Result Value Ref Range Status   Specimen Description BLOOD RIGHT ASSIST CONTROL  Final   Special Requests BOTTLES DRAWN AEROBIC AND ANAEROBIC  2CC  Final   Culture NO GROWTH 5 DAYS  Final   Report Status 03/07/2015 FINAL  Final  Rapid Influenza A&B Antigens ([facility] only)     Status: Abnormal   Collection Time: 03/02/15  6:03 PM  Result Value Ref Range Status   Influenza A ([facility]) NOT DETECTED (A) NEGATIVE Final   Influenza B ([facility]) NOT DETECTED (A) NEGATIVE Final  Urine culture     Status: None   Collection Time: 03/02/15 10:29 PM  Result Value Ref Range Status   Specimen Description URINE, RANDOM  Final   Special Requests NONE  Final   Culture NO GROWTH 2 DAYS  Final   Report Status 03/04/2015 FINAL  Final  Culture, sputum-assessment     Status: None   Collection Time: 03/03/15  8:35 AM  Result Value Ref Range Status   Specimen Description EXPECTORATED SPUTUM  Final   Special Requests NONE  Final   Sputum evaluation THIS SPECIMEN IS ACCEPTABLE FOR SPUTUM CULTURE  Final   Report Status 03/03/2015 FINAL  Final  Culture, respiratory (NON-Expectorated)     Status: None   Collection Time: 03/03/15  8:35 AM  Result Value Ref Range Status   Specimen Description EXPECTORATED SPUTUM  Final   Special Requests NONE Reflexed from [id number]  Final   Gram Stain   Final    FEW WBC SEEN RARE SQUAMOUS EPITHELIAL CELLS PRESENT FEW GRAM POSITIVE COCCI    Culture Consistent with normal respiratory flora.  Final   Report Status 03/06/2015 FINAL  Final    RADIOLOGY:  Ct Chest Wo Contrast  03/08/2015  CLINICAL DATA:  Productive cough. EXAM: CT CHEST WITHOUT CONTRAST TECHNIQUE: Multidetector CT imaging of the chest was performed following the standard protocol without IV contrast. COMPARISON:  None. FINDINGS: Mediastinum / Lymph Nodes: There is no axillary lymphadenopathy. No mediastinal lymphadenopathy. There is no hilar lymphadenopathy. Heart size is  upper normal to mildly increased. Coronary artery calcification is noted. Patient is status post CABG. The esophagus has normal imaging features. Lungs / Pleura: Patchy airspace disease is seen in the right upper lobe, suggesting bronchopneumonia. Mosaic attenuation in the lungs shows decreased vascularity in the hypo attenuating lung parenchyma, compatible with obstructive small airways disease. No suspicious pulmonary nodule or  mass. No pleural effusion. Upper Abdomen:  Unremarkable. MSK / Soft Tissues: Bone windows reveal no worrisome lytic or sclerotic osseous lesions. IMPRESSION: 1. Patchy right upper lobe airspace disease along the minor fissure. Bronchopneumonia is suspected. 2. Mosaic attenuation of the lung parenchyma with imaging features suggestive of obstructive small airways disease. Electronically Signed   By: Kennith Center M.D.   On: 03/08/2015 08:50    Management plans discussed with the patient, and he is in agreement.  CODE STATUS:     Code Status Orders        Start     Ordered   03/02/15 1949  Full code   Continuous     03/02/15 1948    Code Status History    Date Active Date Inactive Code Status Order ID Comments User Context   This patient has a current code status but no historical code status.      TOTAL TIME TAKING CARE OF THIS PATIENT: 35 minutes.    Alford Highland M.D on 03/08/2015 at 3:58 PM  Between 7am to 6pm - Pager - [phone]  After 6pm go to [email or web address] - password [email or web address]  [facility]  Office  [phone]  CC: Primary care physician; Luna Fuse, MD, patient follows up with the physician's assistant at [facility]. Patient follows up with Dr. Cain Saupe cardiology

## 2015-03-08 NOTE — Care Management Note (Signed)
Case Management Note  Patient Details  Name: Esaw GrandchildLarry L Matlock MRN: [id number] Date of Birth: 1955/11/11  Subjective/Objective:   Will Anderson at [facility] was notified to please deliver a nebulizer machine and a portable oxygen tank [facility] and set up home oxygen.                 Action/Plan:   Expected Discharge Date:                  Expected Discharge Plan:     In-House Referral:     Discharge planning Services  CM Consult  Post Acute Care Choice:    Choice offered to:  Patient  DME Arranged:    DME Agency:     HH Arranged:    HH Agency:     Status of Service:  Completed, signed off  Medicare Important Message Given:    Date Medicare IM Given:    Medicare IM give by:    Date Additional Medicare IM Given:    Additional Medicare Important Message give by:     If discussed at Long Length of Stay Meetings, dates discussed:    Additional Comments:  Devarion Mcclanahan A, RN 03/08/2015, 11:00 AM

## 2015-06-17 ENCOUNTER — Other Ambulatory Visit: Payer: Self-pay | Admitting: Internal Medicine

## 2015-06-17 DIAGNOSIS — I509 Heart failure, unspecified: Secondary | ICD-10-CM

## 2015-07-01 ENCOUNTER — Ambulatory Visit: Payer: Disability Insurance | Attending: Internal Medicine

## 2015-07-01 DIAGNOSIS — J9690 Respiratory failure, unspecified, unspecified whether with hypoxia or hypercapnia: Secondary | ICD-10-CM | POA: Diagnosis not present

## 2015-07-01 DIAGNOSIS — E119 Type 2 diabetes mellitus without complications: Secondary | ICD-10-CM | POA: Diagnosis not present

## 2015-07-01 DIAGNOSIS — I10 Essential (primary) hypertension: Secondary | ICD-10-CM | POA: Insufficient documentation

## 2015-07-01 DIAGNOSIS — I509 Heart failure, unspecified: Secondary | ICD-10-CM | POA: Diagnosis present

## 2015-07-01 MED ORDER — ALBUTEROL SULFATE (2.5 MG/3ML) 0.083% IN NEBU
2.5000 mg | INHALATION_SOLUTION | Freq: Once | RESPIRATORY_TRACT | Status: AC
  Administered 2015-07-01: 2.5 mg via RESPIRATORY_TRACT
  Filled 2015-07-01: qty 3

## 2016-05-26 IMAGING — CT CT CHEST W/O CM
1 series · 15 of 33 positions shown, 19 images · non-contrast
Comparison: None.

CLINICAL DATA: Productive cough.

EXAM:
CT CHEST WITHOUT CONTRAST
TECHNIQUE: Multidetector CT imaging of the chest was performed following the
standard protocol without IV contrast.

[Series 2: routine chest wo · axial · 0.80mm/px · z∈[-100,+175]mm · 15 of 65 slices shown, 19 images]
[im 5/65  mediastinal]
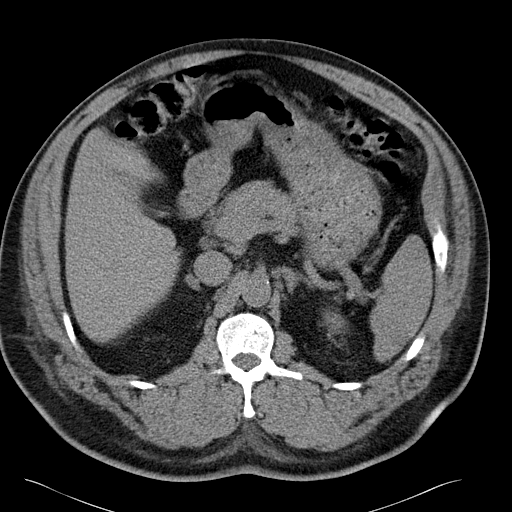
[im 5/65  lung]
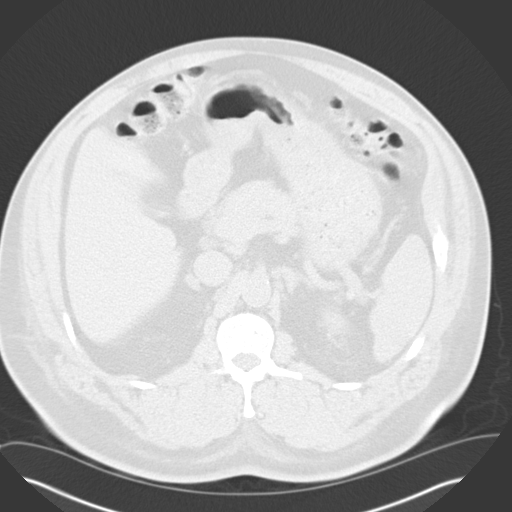
[im 10/65  lung]
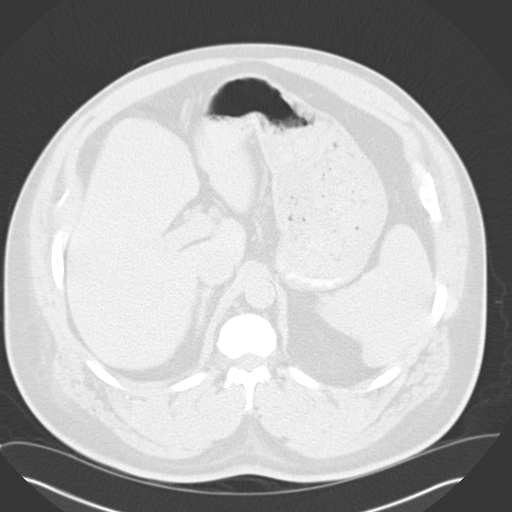
[im 13/65  lung]
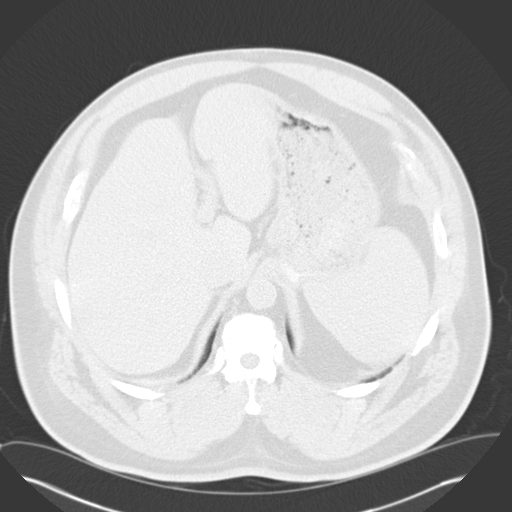
[im 17/65  lung]
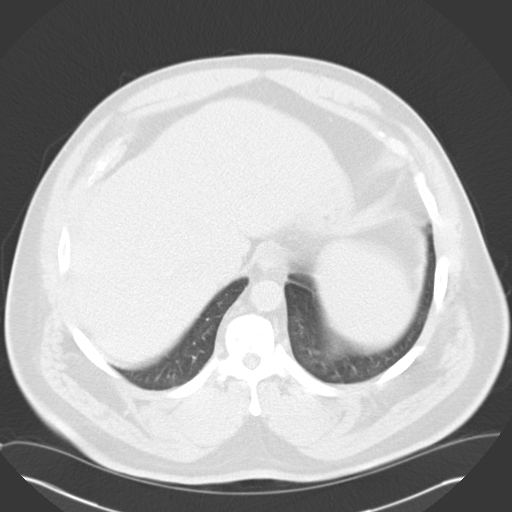
[im 22/65  mediastinal]
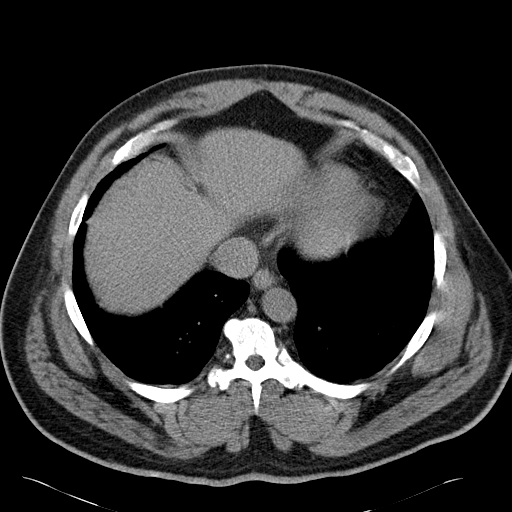
[im 22/65  lung]
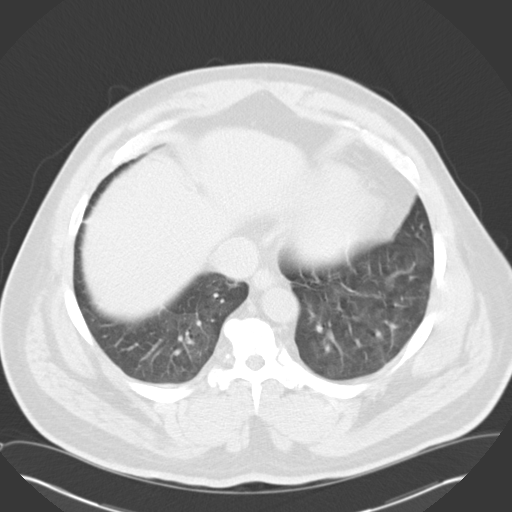
[im 26/65  lung]
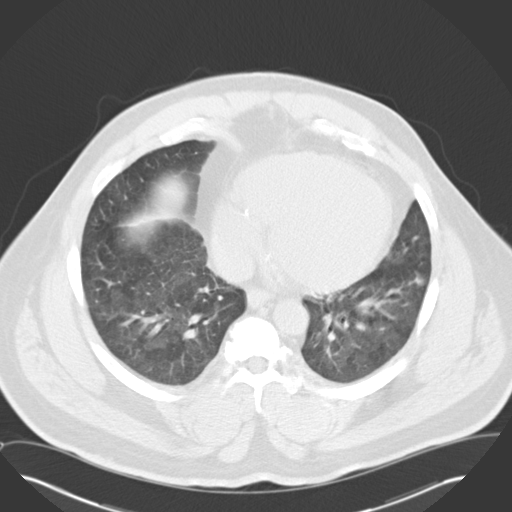
[im 29/65  lung]
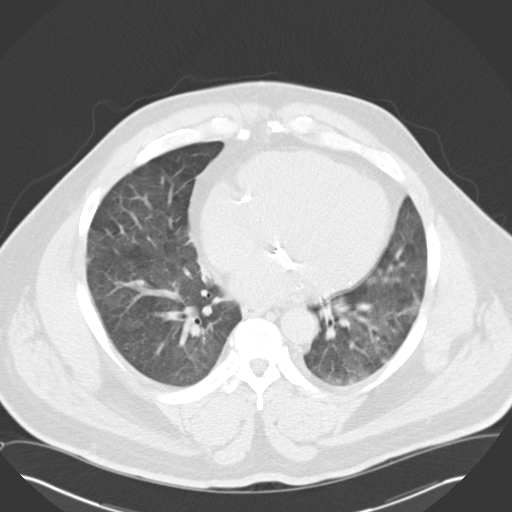
[im 34/65  lung]
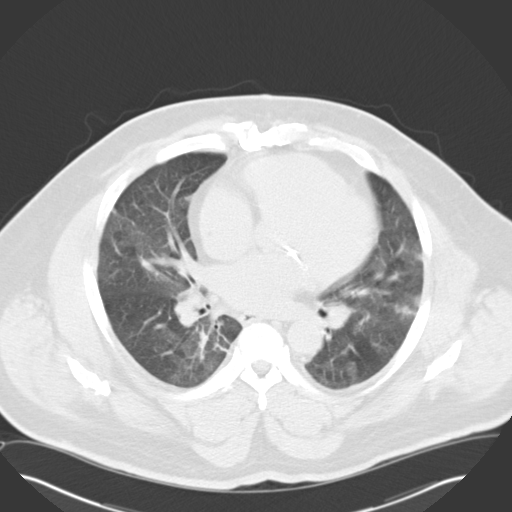
[im 36/65  mediastinal]
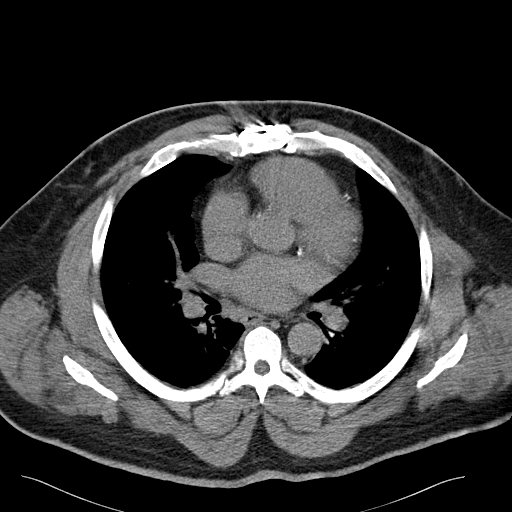
[im 36/65  lung]
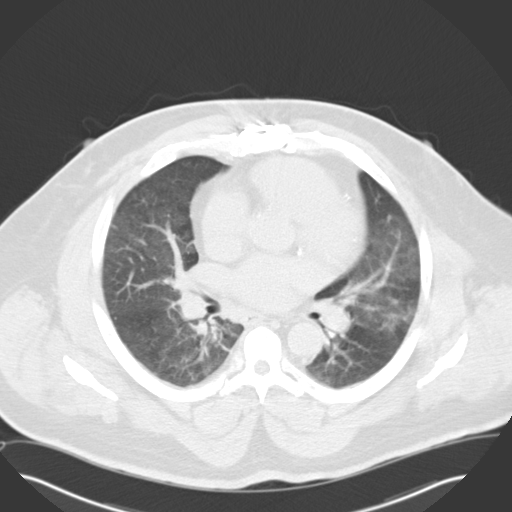
[im 39/65  lung]
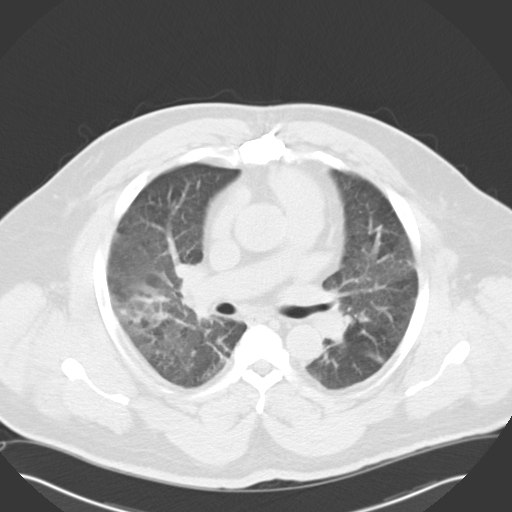
[im 43/65  lung]
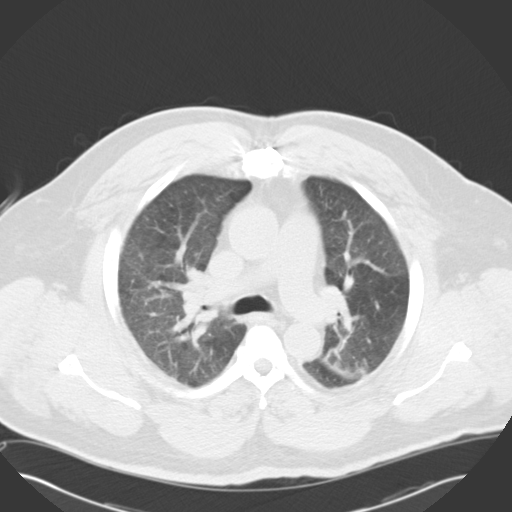
[im 48/65  lung]
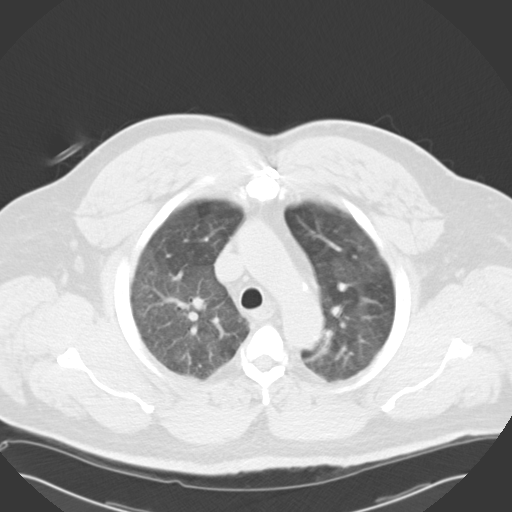
[im 52/65  mediastinal]
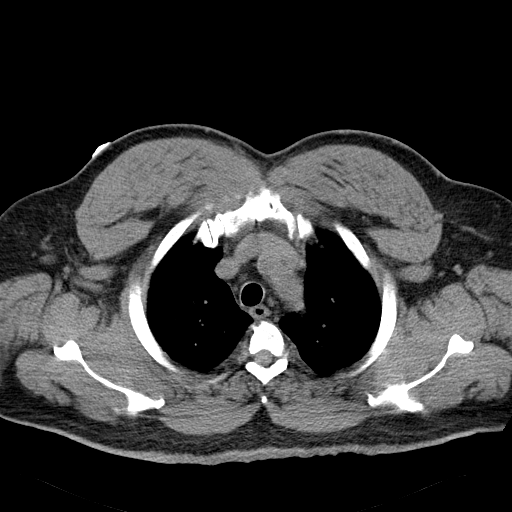
[im 52/65  lung]
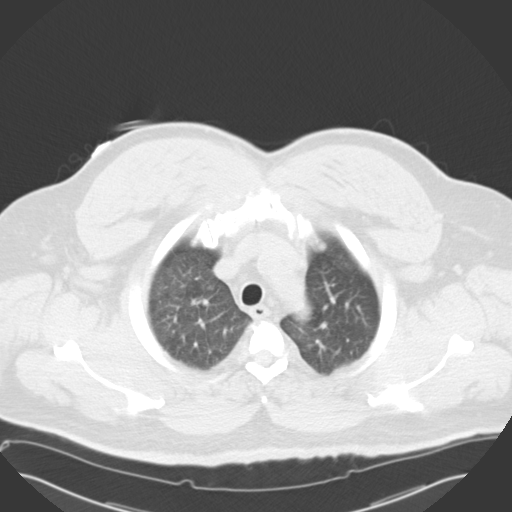
[im 55/65  lung]
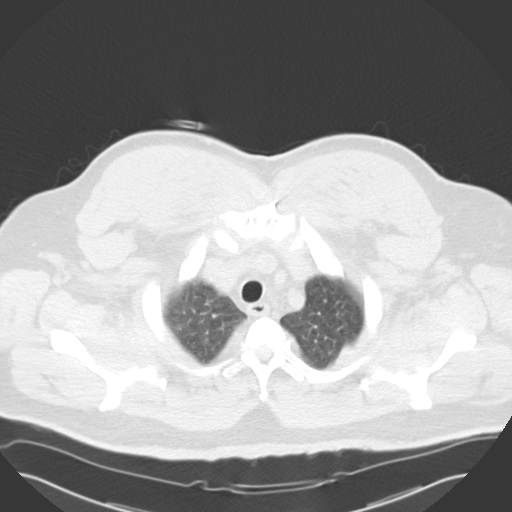
[im 60/65  lung]
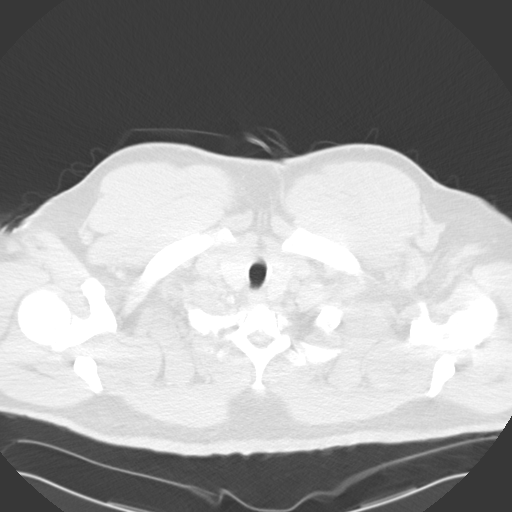

[15 of 33 positions shown; findings below may reference images not displayed]

FINDINGS: Mediastinum / Lymph Nodes: There is no axillary lymphadenopathy. No
mediastinal lymphadenopathy. There is no hilar lymphadenopathy.
Heart size is upper normal to mildly increased. Coronary artery
calcification is noted. Patient is status post CABG. The esophagus
has normal imaging features.

Lungs / Pleura: Patchy airspace disease is seen in the right upper
lobe, suggesting bronchopneumonia. Mosaic attenuation in the lungs
shows decreased vascularity in the hypo attenuating lung parenchyma,
compatible with obstructive small airways disease. No suspicious
pulmonary nodule or mass. No pleural effusion.

Upper Abdomen:  Unremarkable.

[HOSPITAL] / Soft Tissues: Bone windows reveal no worrisome lytic or
sclerotic osseous lesions.
IMPRESSION: 1. Patchy right upper lobe airspace disease along the minor fissure.
Bronchopneumonia is suspected.
2. Mosaic attenuation of the lung parenchyma with imaging features
suggestive of obstructive small airways disease.

## 2017-01-19 ENCOUNTER — Other Ambulatory Visit: Payer: Self-pay | Admitting: Student

## 2017-01-19 DIAGNOSIS — R6881 Early satiety: Secondary | ICD-10-CM

## 2017-01-19 DIAGNOSIS — K30 Functional dyspepsia: Secondary | ICD-10-CM

## 2017-01-29 ENCOUNTER — Ambulatory Visit: Payer: BLUE CROSS/BLUE SHIELD | Attending: Student

## 2017-02-03 ENCOUNTER — Encounter: Admission: RE | Admit: 2017-02-03 | Payer: BLUE CROSS/BLUE SHIELD | Source: Ambulatory Visit

## 2018-09-03 DIAGNOSIS — I4891 Unspecified atrial fibrillation: Secondary | ICD-10-CM

## 2018-09-03 HISTORY — DX: Unspecified atrial fibrillation: I48.91

## 2019-01-07 ENCOUNTER — Other Ambulatory Visit: Payer: Self-pay

## 2019-01-07 ENCOUNTER — Encounter: Payer: Self-pay | Admitting: Emergency Medicine

## 2019-01-07 ENCOUNTER — Inpatient Hospital Stay
Admission: EM | Admit: 2019-01-07 | Discharge: 2019-01-09 | DRG: 177 | Disposition: A | Discharge status: Other Acute Inpatient Hospital | Payer: Medicare HMO | Attending: Hospitalist | Admitting: Hospitalist

## 2019-01-07 ENCOUNTER — Emergency Department: Payer: Medicare HMO

## 2019-01-07 DIAGNOSIS — E785 Hyperlipidemia, unspecified: Secondary | ICD-10-CM | POA: Diagnosis present

## 2019-01-07 DIAGNOSIS — R0902 Hypoxemia: Secondary | ICD-10-CM

## 2019-01-07 DIAGNOSIS — Z794 Long term (current) use of insulin: Secondary | ICD-10-CM

## 2019-01-07 DIAGNOSIS — Z79899 Other long term (current) drug therapy: Secondary | ICD-10-CM | POA: Diagnosis not present

## 2019-01-07 DIAGNOSIS — U071 COVID-19: Principal | ICD-10-CM

## 2019-01-07 DIAGNOSIS — I10 Essential (primary) hypertension: Secondary | ICD-10-CM | POA: Diagnosis not present

## 2019-01-07 DIAGNOSIS — E876 Hypokalemia: Secondary | ICD-10-CM

## 2019-01-07 DIAGNOSIS — I130 Hypertensive heart and chronic kidney disease with heart failure and stage 1 through stage 4 chronic kidney disease, or unspecified chronic kidney disease: Secondary | ICD-10-CM | POA: Diagnosis present

## 2019-01-07 DIAGNOSIS — G4730 Sleep apnea, unspecified: Secondary | ICD-10-CM | POA: Diagnosis present

## 2019-01-07 DIAGNOSIS — J069 Acute upper respiratory infection, unspecified: Secondary | ICD-10-CM | POA: Diagnosis present

## 2019-01-07 DIAGNOSIS — E1129 Type 2 diabetes mellitus with other diabetic kidney complication: Secondary | ICD-10-CM | POA: Diagnosis present

## 2019-01-07 DIAGNOSIS — E1165 Type 2 diabetes mellitus with hyperglycemia: Secondary | ICD-10-CM | POA: Diagnosis not present

## 2019-01-07 DIAGNOSIS — Z6834 Body mass index (BMI) 34.0-34.9, adult: Secondary | ICD-10-CM

## 2019-01-07 DIAGNOSIS — E1122 Type 2 diabetes mellitus with diabetic chronic kidney disease: Secondary | ICD-10-CM | POA: Diagnosis present

## 2019-01-07 DIAGNOSIS — Z7982 Long term (current) use of aspirin: Secondary | ICD-10-CM | POA: Diagnosis not present

## 2019-01-07 DIAGNOSIS — I5032 Chronic diastolic (congestive) heart failure: Secondary | ICD-10-CM | POA: Diagnosis present

## 2019-01-07 DIAGNOSIS — N1831 Chronic kidney disease, stage 3a: Secondary | ICD-10-CM | POA: Diagnosis present

## 2019-01-07 DIAGNOSIS — J1282 Pneumonia due to coronavirus disease 2019: Secondary | ICD-10-CM | POA: Diagnosis present

## 2019-01-07 DIAGNOSIS — E669 Obesity, unspecified: Secondary | ICD-10-CM | POA: Diagnosis present

## 2019-01-07 DIAGNOSIS — J9601 Acute respiratory failure with hypoxia: Secondary | ICD-10-CM | POA: Diagnosis present

## 2019-01-07 DIAGNOSIS — N179 Acute kidney failure, unspecified: Secondary | ICD-10-CM | POA: Diagnosis present

## 2019-01-07 DIAGNOSIS — T380X5A Adverse effect of glucocorticoids and synthetic analogues, initial encounter: Secondary | ICD-10-CM | POA: Diagnosis present

## 2019-01-07 DIAGNOSIS — Z7951 Long term (current) use of inhaled steroids: Secondary | ICD-10-CM | POA: Diagnosis not present

## 2019-01-07 LAB — CBC WITH DIFFERENTIAL/PLATELET
Abs Immature Granulocytes: 0.04 K/uL (ref 0.00–0.07)
Basophils Absolute: 0 K/uL (ref 0.0–0.1)
Basophils Relative: 0 %
Eosinophils Absolute: 0 K/uL (ref 0.0–0.5)
Eosinophils Relative: 0 %
HCT: 40.4 % (ref 39.0–52.0)
Hemoglobin: 13.1 g/dL (ref 13.0–17.0)
Immature Granulocytes: 1 %
Lymphocytes Relative: 12 %
Lymphs Abs: 0.9 K/uL (ref 0.7–4.0)
MCH: 27.2 pg (ref 26.0–34.0)
MCHC: 32.4 g/dL (ref 30.0–36.0)
MCV: 83.8 fL (ref 80.0–100.0)
Monocytes Absolute: 0.9 K/uL (ref 0.1–1.0)
Monocytes Relative: 13 %
Neutro Abs: 5.3 K/uL (ref 1.7–7.7)
Neutrophils Relative %: 74 %
Platelets: 144 K/uL — ABNORMAL LOW (ref 150–400)
RBC: 4.82 MIL/uL (ref 4.22–5.81)
RDW: 14 % (ref 11.5–15.5)
WBC: 7.2 K/uL (ref 4.0–10.5)
nRBC: 0 % (ref 0.0–0.2)

## 2019-01-07 LAB — BLOOD GAS, VENOUS
Acid-Base Excess: 10.2 mmol/L — ABNORMAL HIGH (ref 0.0–2.0)
Bicarbonate: 38.2 mmol/L — ABNORMAL HIGH (ref 20.0–28.0)
FIO2: 100
O2 Saturation: 83 %
Patient temperature: 37
pCO2, Ven: 66 mmHg — ABNORMAL HIGH (ref 44.0–60.0)
pH, Ven: 7.37 (ref 7.250–7.430)
pO2, Ven: 49 mmHg — ABNORMAL HIGH (ref 32.0–45.0)

## 2019-01-07 LAB — LACTIC ACID, PLASMA
Lactic Acid, Venous: 1.2 mmol/L (ref 0.5–1.9)
Lactic Acid, Venous: 2.5 mmol/L (ref 0.5–1.9)

## 2019-01-07 LAB — D-DIMER, QUANTITATIVE: D-Dimer, Quant: 2.61 ug/mL-FEU — ABNORMAL HIGH (ref 0.00–0.50)

## 2019-01-07 LAB — C-REACTIVE PROTEIN: CRP: 3.7 mg/dL — ABNORMAL HIGH

## 2019-01-07 LAB — GLUCOSE, CAPILLARY
Glucose-Capillary: 287 mg/dL — ABNORMAL HIGH (ref 70–99)
Glucose-Capillary: 331 mg/dL — ABNORMAL HIGH (ref 70–99)

## 2019-01-07 LAB — COMPREHENSIVE METABOLIC PANEL WITH GFR
ALT: 33 U/L (ref 0–44)
AST: 71 U/L — ABNORMAL HIGH (ref 15–41)
Albumin: 4 g/dL (ref 3.5–5.0)
Alkaline Phosphatase: 171 U/L — ABNORMAL HIGH (ref 38–126)
Anion gap: 16 — ABNORMAL HIGH (ref 5–15)
BUN: 22 mg/dL (ref 8–23)
CO2: 28 mmol/L (ref 22–32)
Calcium: 7.9 mg/dL — ABNORMAL LOW (ref 8.9–10.3)
Chloride: 92 mmol/L — ABNORMAL LOW (ref 98–111)
Creatinine, Ser: 1.93 mg/dL — ABNORMAL HIGH (ref 0.61–1.24)
GFR calc Af Amer: 42 mL/min — ABNORMAL LOW
GFR calc non Af Amer: 36 mL/min — ABNORMAL LOW
Glucose, Bld: 361 mg/dL — ABNORMAL HIGH (ref 70–99)
Potassium: 3.3 mmol/L — ABNORMAL LOW (ref 3.5–5.1)
Sodium: 136 mmol/L (ref 135–145)
Total Bilirubin: 0.9 mg/dL (ref 0.3–1.2)
Total Protein: 7.3 g/dL (ref 6.5–8.1)

## 2019-01-07 LAB — TROPONIN I (HIGH SENSITIVITY)
Troponin I (High Sensitivity): 39 ng/L — ABNORMAL HIGH (ref <18)
Troponin I (High Sensitivity): 38 ng/L — ABNORMAL HIGH (ref <18)

## 2019-01-07 LAB — HEPATITIS B SURFACE ANTIGEN: Hepatitis B Surface Ag: NONREACTIVE

## 2019-01-07 LAB — FIBRINOGEN: Fibrinogen: 543 mg/dL — ABNORMAL HIGH (ref 210–475)

## 2019-01-07 LAB — BRAIN NATRIURETIC PEPTIDE: B Natriuretic Peptide: 203 pg/mL — ABNORMAL HIGH (ref 0.0–100.0)

## 2019-01-07 LAB — HIV ANTIBODY (ROUTINE TESTING W REFLEX): HIV Screen 4th Generation wRfx: NONREACTIVE

## 2019-01-07 LAB — FERRITIN: Ferritin: 205 ng/mL (ref 24–336)

## 2019-01-07 LAB — TRIGLYCERIDES: Triglycerides: 109 mg/dL (ref <150)

## 2019-01-07 LAB — PROCALCITONIN: Procalcitonin: 0.91 ng/mL

## 2019-01-07 LAB — LACTATE DEHYDROGENASE: LDH: 442 U/L — ABNORMAL HIGH (ref 98–192)

## 2019-01-07 MED ORDER — ENOXAPARIN SODIUM 40 MG/0.4ML ~~LOC~~ SOLN
40.0000 mg | SUBCUTANEOUS | Status: DC
  Administered 2019-01-07 – 2019-01-08 (×2): 40 mg via SUBCUTANEOUS
  Filled 2019-01-07 (×4): qty 0.4

## 2019-01-07 MED ORDER — DM-GUAIFENESIN ER 30-600 MG PO TB12
1.0000 | ORAL_TABLET | Freq: Two times a day (BID) | ORAL | Status: DC
  Administered 2019-01-07 – 2019-01-09 (×3): 1 via ORAL
  Filled 2019-01-07 (×5): qty 1

## 2019-01-07 MED ORDER — DEXAMETHASONE SODIUM PHOSPHATE 10 MG/ML IJ SOLN
10.0000 mg | Freq: Once | INTRAMUSCULAR | Status: AC
  Administered 2019-01-07: 10 mg via INTRAVENOUS
  Filled 2019-01-07: qty 1

## 2019-01-07 MED ORDER — ACETAMINOPHEN 325 MG PO TABS
650.0000 mg | ORAL_TABLET | Freq: Four times a day (QID) | ORAL | Status: DC | PRN
  Administered 2019-01-08: 650 mg via ORAL
  Filled 2019-01-07: qty 2

## 2019-01-07 MED ORDER — ALBUTEROL SULFATE HFA 108 (90 BASE) MCG/ACT IN AERS
2.0000 | INHALATION_SPRAY | RESPIRATORY_TRACT | Status: DC | PRN
  Administered 2019-01-07 – 2019-01-08 (×3): 2 via RESPIRATORY_TRACT
  Filled 2019-01-07: qty 6.7

## 2019-01-07 MED ORDER — INSULIN ASPART 100 UNIT/ML ~~LOC~~ SOLN
0.0000 [IU] | SUBCUTANEOUS | Status: DC
  Administered 2019-01-07: 5 [IU] via SUBCUTANEOUS
  Administered 2019-01-07: 7 [IU] via SUBCUTANEOUS
  Administered 2019-01-08: 9 [IU] via SUBCUTANEOUS
  Administered 2019-01-08: 7 [IU] via SUBCUTANEOUS
  Administered 2019-01-08: 9 [IU] via SUBCUTANEOUS
  Administered 2019-01-08: 7 [IU] via SUBCUTANEOUS
  Filled 2019-01-07 (×7): qty 1

## 2019-01-07 MED ORDER — ASCORBIC ACID 500 MG PO TABS
500.0000 mg | ORAL_TABLET | Freq: Every day | ORAL | Status: DC
  Administered 2019-01-07 – 2019-01-09 (×3): 500 mg via ORAL
  Filled 2019-01-07 (×3): qty 1

## 2019-01-07 MED ORDER — POTASSIUM CHLORIDE CRYS ER 20 MEQ PO TBCR
40.0000 meq | EXTENDED_RELEASE_TABLET | Freq: Once | ORAL | Status: AC
  Administered 2019-01-07: 40 meq via ORAL
  Filled 2019-01-07: qty 2

## 2019-01-07 MED ORDER — SODIUM CHLORIDE 0.9 % IV SOLN
100.0000 mg | Freq: Every day | INTRAVENOUS | Status: DC
  Administered 2019-01-08 – 2019-01-09 (×2): 100 mg via INTRAVENOUS
  Filled 2019-01-07: qty 20
  Filled 2019-01-07: qty 100

## 2019-01-07 MED ORDER — METHYLPREDNISOLONE SODIUM SUCC 125 MG IJ SOLR
60.0000 mg | Freq: Two times a day (BID) | INTRAMUSCULAR | Status: DC
  Administered 2019-01-08 – 2019-01-09 (×4): 60 mg via INTRAVENOUS
  Filled 2019-01-07 (×2): qty 0.96
  Filled 2019-01-07 (×2): qty 2
  Filled 2019-01-07: qty 0.96

## 2019-01-07 MED ORDER — IPRATROPIUM BROMIDE HFA 17 MCG/ACT IN AERS
2.0000 | INHALATION_SPRAY | RESPIRATORY_TRACT | Status: DC
  Administered 2019-01-07 – 2019-01-09 (×7): 2 via RESPIRATORY_TRACT
  Filled 2019-01-07 (×2): qty 12.9

## 2019-01-07 MED ORDER — ZINC SULFATE 220 (50 ZN) MG PO CAPS
220.0000 mg | ORAL_CAPSULE | Freq: Every day | ORAL | Status: DC
  Administered 2019-01-07 – 2019-01-09 (×3): 220 mg via ORAL
  Filled 2019-01-07 (×3): qty 1

## 2019-01-07 MED ORDER — SODIUM CHLORIDE 0.9 % IV BOLUS
1000.0000 mL | Freq: Once | INTRAVENOUS | Status: AC
  Administered 2019-01-07: 1000 mL via INTRAVENOUS

## 2019-01-07 MED ORDER — INSULIN ASPART 100 UNIT/ML ~~LOC~~ SOLN
8.0000 [IU] | Freq: Once | SUBCUTANEOUS | Status: AC
  Administered 2019-01-07: 8 [IU] via INTRAVENOUS
  Filled 2019-01-07: qty 1

## 2019-01-07 MED ORDER — HYDRALAZINE HCL 25 MG PO TABS
25.0000 mg | ORAL_TABLET | Freq: Three times a day (TID) | ORAL | Status: DC | PRN
  Filled 2019-01-07: qty 1

## 2019-01-07 MED ORDER — SODIUM CHLORIDE 0.9 % IV SOLN
200.0000 mg | Freq: Once | INTRAVENOUS | Status: AC
  Administered 2019-01-07: 200 mg via INTRAVENOUS
  Filled 2019-01-07: qty 200

## 2019-01-07 NOTE — ED Notes (Signed)
Pt given dinner tray and drink. Pt urinated in bed and dropped cup of water on ground. Pt assisted up to change into new gown. Pt provided self with peri care. Linens changed. Floor dried. Bed locked low. Rails up. Call bell within reach.

## 2019-01-07 NOTE — ED Notes (Signed)
Brittney NT collecting full rainbow. Collecting a set of blood cultures as well. This RN will collect next set when able.

## 2019-01-07 NOTE — ED Notes (Signed)
Pt given food tray and drink.  

## 2019-01-07 NOTE — ED Triage Notes (Signed)
Pt in via [facility] from home; per EMS, dispatch received call from family member who reported altered mental status.  Pt complains of worsening shortness of breath since last night.  Pt reports being Covid+, result yesterday.  Per EMS, pt room air saturation 81%, pt on NRB mask upon arrival.  Breathing labored, decreased LOC noted.  EDP, Paduchowski called to bedside at this time.

## 2019-01-07 NOTE — ED Notes (Signed)
Attempted for 2nd set of cultures. Only able to collect minimal amount in blue tube and none in red. Sent to lab.

## 2019-01-07 NOTE — ED Provider Notes (Signed)
[facility] Emergency Department Provider Note  Time seen: 12:38 PM  I have reviewed the triage vital signs and the nursing notes.   HISTORY  Chief Complaint Covid   HPI [name] is a 64 y.o. male with a past medical history of CHF, CKD, diabetes, hypertension, hyperlipidemia, obesity, presents to the emergency department for generalized fatigue and altered mental status.  According to family per EMS they reported the patient was complaining of significant shortness of breath overnight coughing and this morning was confused.  Here the patient is alert and oriented to person and place only but not date.  EMS paramedic is very familiar with this patient states he is definitely confused versus his baseline currently.  Patient found to have a blood glucose greater than 500 on CBG.  Patient is hypoxic 81% on room air and tested positive for Covid yesterday.   Past Medical History:  Diagnosis Date  . CHF (congestive heart failure) (HCC)   . CKD (chronic kidney disease), stage III   . Diabetes mellitus without complication (HCC)   . Hyperlipidemia   . Hypertension   . Obesity   . Pneumonia     Patient Active Problem List   Diagnosis Date Noted  . Pneumonia 03/02/2015    Past Surgical History:  Procedure Laterality Date  . REPAIR OF LEFT VENTRICLE LACERATION      Prior to Admission medications   Medication Sig Start Date End Date Taking? Authorizing Provider  albuterol (PROVENTIL HFA;VENTOLIN HFA) 108 (90 Base) MCG/ACT inhaler Inhale 2 puffs into the lungs every 6 (six) hours as needed for wheezing or shortness of breath. 03/08/15   Alford Highland, MD  amLODipine (NORVASC) 10 MG tablet Take 10 mg by mouth daily.    [provider]  aspirin EC 81 MG tablet Take 81 mg by mouth daily.    [provider]  atorvastatin (LIPITOR) 20 MG tablet Take 20 mg by mouth at bedtime.    [provider]  canagliflozin (INVOKANA) 300 MG TABS  tablet Take 300 mg by mouth daily before breakfast.    [provider]  doxycycline (VIBRA-TABS) 100 MG tablet Take 1 tablet (100 mg total) by mouth every 12 (twelve) hours. 03/08/15   Alford Highland, MD  fluticasone (FLOVENT HFA) 220 MCG/ACT inhaler Inhale 1 puff into the lungs 2 (two) times daily. 03/08/15   Alford Highland, MD  furosemide (LASIX) 40 MG tablet Take 1 tablet (40 mg total) by mouth 2 (two) times daily. 03/08/15   Alford Highland, MD  insulin detemir (LEVEMIR) 100 UNIT/ML injection Inject 55 Units into the skin 2 (two) times daily.    [provider]  ipratropium-albuterol (DUONEB) 0.5-2.5 (3) MG/3ML SOLN Take 3 mLs by nebulization every 6 (six) hours. 03/08/15   Alford Highland, MD  lisinopril (PRINIVIL,ZESTRIL) 5 MG tablet Take 5 mg by mouth daily.    [provider]  metoprolol (LOPRESSOR) 50 MG tablet Take 50 mg by mouth 2 (two) times daily.    [provider]  predniSONE (DELTASONE) 5 MG tablet Take 4 tabs po day1; 3 tabs po day2; 2 tabs po day3; 1 tabs po day4,5 03/08/15   Alford Highland, MD  saxagliptin HCl (ONGLYZA) 2.5 MG TABS tablet Take 5 mg by mouth daily.    [provider]    No Known Allergies  No family history on file.  Social History Social History   Tobacco Use  . Smoking status: Never Smoker  Substance Use Topics  .  Alcohol use: No  . Drug use: Not on file    Review of Systems Constitutional: No known fever per patient Cardiovascular: Negative for chest pain. Respiratory: Positive shortness of breath.  Positive for cough. Gastrointestinal: Negative for abdominal pain Genitourinary: Negative for urinary compaints Musculoskeletal: Negative for musculoskeletal complaints Neurological: Negative for headache All other ROS negative, although possibly limited due to confusion.  ____________________________________________   PHYSICAL EXAM:  VITAL SIGNS: ED Triage Vitals  Enc Vitals Group     BP 01/07/19  1228 (!) 166/89     Pulse Rate 01/07/19 1228 88     Resp 01/07/19 1228 (!) 34     Temp 01/07/19 1228 98.2 F (36.8 C)     Temp Source 01/07/19 1228 Axillary     SpO2 01/07/19 1227 (!) 81 %     Weight 01/07/19 1236 220 lb (99.8 kg)     Height 01/07/19 1236 5\' 7"  (1.702 m)     Head Circumference --      Peak Flow --      Pain Score 01/07/19 1232 0     Pain Loc --      Pain Edu? --      Excl. in [place]? --    Constitutional: Alert and oriented to person and place but not time.  Currently on a nonrebreather mask satting 95%. Eyes: Normal exam ENT      Head: Normocephalic and atraumatic.      Mouth/Throat: Mucous membranes are moist. Cardiovascular: Normal rate, regular rhythm. Respiratory: Patient is mildly tachypneic, has bilateral rhonchi. Gastrointestinal: Soft and nontender. No distention.  Obese. Musculoskeletal: Nontender with normal range of motion in all extremities. Neurologic:  Normal speech and language.  Able to move all extremities. Skin:  Skin is warm, dry  Psychiatric: Mood and affect are normal.  ____________________________________________    EKG  EKG viewed and interpreted by myself shows a sinus rhythm 89 bpm with a widened QRS, normal axis, slight QTC prolongation overall consistent with left bundle branch block, which appears to be somewhat present in prior EKG 03/02/2015  ____________________________________________    RADIOLOGY  Patchy bilateral opacities consistent multifocal pneumonia.  ____________________________________________   INITIAL IMPRESSION / ASSESSMENT AND PLAN / ED COURSE  Pertinent labs & imaging results that were available during my care of the patient were reviewed by me and considered in my medical decision making (see chart for details).   Patient presents to the emergency department for confusion, cough and shortness of breath found to be Covid positive yesterday.  Differential would include worsening Covid symptoms, dehydration,  electrolyte or metabolic abnormality, pneumonia, DKA, HHS.  We will check labs including VBG, IV hydrate, obtain a chest x-ray.  We will place the patient on high flow nasal cannula oxygen and continue to closely monitor.  Patient's blood glucose is 361 on chemistry with mildly worsening renal function compared to baseline.  We will dose IV insulin, continue to dose IV fluids.  Patient chest x-ray shows multifocal pneumonia consistent with COVID-19.  Given the patient's hypoxia now on high flow oxygen with multifocal pneumonia due to COVID-19 and hyperglycemia patient will be admitted to the hospital service for further treatment.  DARROW BARREIRO was evaluated in Emergency Department on 01/07/2019 for the symptoms described in the history of present illness. He was evaluated in the context of the global COVID-19 pandemic, which necessitated consideration that the patient might be at risk for infection with the SARS-CoV-2 virus that causes COVID-19. Institutional protocols and algorithms  that pertain to the evaluation of patients at risk for COVID-19 are in a state of rapid change based on information released by regulatory bodies including the CDC and federal and state organizations. These policies and algorithms were followed during the patient's care in the ED.  CRITICAL CARE Performed by: Minna Antis   Total critical care time: 30 minutes  Critical care time was exclusive of separately billable procedures and treating other patients.  Critical care was necessary to treat or prevent imminent or life-threatening deterioration.  Critical care was time spent personally by me on the following activities: development of treatment plan with patient and/or surrogate as well as nursing, discussions with consultants, evaluation of patient's response to treatment, examination of patient, obtaining history from patient or surrogate, ordering and performing treatments and interventions, ordering and review  of laboratory studies, ordering and review of radiographic studies, pulse oximetry and re-evaluation of patient's condition.   ____________________________________________   FINAL CLINICAL IMPRESSION(S) / ED DIAGNOSES  COVID-19 Hypoxia Hyperglycemia   Minna Antis, MD 01/07/19 1423

## 2019-01-07 NOTE — ED Notes (Signed)
Pt's daughter Erle Crocker updated with pt's verbal okay.

## 2019-01-07 NOTE — H&P (Signed)
History and Physical    [name] CXK:[id number] DOB: 05/23/1955 DOA: 01/07/2019  Referring MD/NP/PA:   PCP: [name], MD   Patient coming from:  The patient is coming from home.  At baseline, pt is independent for most of ADL.        Chief Complaint: cough and SOB  HPI: [name] is a 64 y.o. male with medical history significant of hypertension, hyperlipidemia, diabetes mellitus, dCHF, CKD stage III, who presents with a cough, shortness breath.  Patient has been having cough, shortness of breath for more than 4 days.  He had a positive COVID-19 test yesterday.  His shortness breath has been progressively worsening since last night.  His oxygen saturation is 81% on room air in the ED. Pt on NRB mask upon arrival.  Family reported to ED physician that the patient had confusion earlier, but when I saw patient in ED, he is alert, orientated x3.  Mental status normal.  Patient does not have chest pain, nausea, vomiting, diarrhea, abdominal pain and symptoms of UTI or unilateral weakness.  No facial droop or slurred speech.  Currently no fever or chills. He has dry cough.   ED Course: pt was found to have WBC 7.2, lactic acid 1.2, potassium 3.3, worsening renal function, temperature normal, blood pressure 136/89, heart rate 88, tachypnea, chest x-ray showed bilateral infiltration and cardiomegaly.  Patient is limited to MedSurg bed as inpatient.  Review of Systems:   General: no fevers, chills, no body weight gain, has fatigue HEENT: no blurry vision, hearing changes or sore throat Respiratory: has dyspnea, coughing, no wheezing CV: no chest pain, no palpitations GI: no nausea, vomiting, abdominal pain, diarrhea, constipation GU: no dysuria, burning on urination, increased urinary frequency, hematuria  Ext: no leg edema Neuro: no unilateral weakness, numbness, or tingling, no vision change or hearing loss Skin: no rash, no skin tear. MSK: No muscle spasm, no deformity, no  limitation of range of movement in spin Heme: No easy bruising.  Travel history: No recent long distant travel.  Allergy: No Known Allergies  Past Medical History:  Diagnosis Date  . CHF (congestive heart failure) ([place])   . CKD (chronic kidney disease), stage III   . Diabetes mellitus without complication ([place])   . Hyperlipidemia   . Hypertension   . Obesity   . Pneumonia     Past Surgical History:  Procedure Laterality Date  . REPAIR OF LEFT VENTRICLE LACERATION      Social History:  reports that he has never smoked. He has never used smokeless tobacco. He reports that he does not drink alcohol or use drugs.  Family History: No family history on file.   Prior to Admission medications   Medication Sig Start Date End Date Taking? Authorizing Provider  albuterol (PROVENTIL HFA;VENTOLIN HFA) 108 (90 Base) MCG/ACT inhaler Inhale 2 puffs into the lungs every 6 (six) hours as needed for wheezing or shortness of breath. 03/08/15   Loletha Grayer, MD  amLODipine (NORVASC) 10 MG tablet Take 10 mg by mouth daily.    [provider]  aspirin EC 81 MG tablet Take 81 mg by mouth daily.    [provider]  atorvastatin (LIPITOR) 20 MG tablet Take 20 mg by mouth at bedtime.    [provider]  canagliflozin (INVOKANA) 300 MG TABS tablet Take 300 mg by mouth daily before breakfast.    [provider]  doxycycline (VIBRA-TABS) 100 MG tablet Take 1 tablet (100 mg  total) by mouth every 12 (twelve) hours. 03/08/15   Alford Highland, MD  fluticasone (FLOVENT HFA) 220 MCG/ACT inhaler Inhale 1 puff into the lungs 2 (two) times daily. 03/08/15   Alford Highland, MD  furosemide (LASIX) 40 MG tablet Take 1 tablet (40 mg total) by mouth 2 (two) times daily. 03/08/15   Alford Highland, MD  insulin detemir (LEVEMIR) 100 UNIT/ML injection Inject 55 Units into the skin 2 (two) times daily.    [provider]  ipratropium-albuterol (DUONEB) 0.5-2.5 (3) MG/3ML SOLN Take  3 mLs by nebulization every 6 (six) hours. 03/08/15   Alford Highland, MD  lisinopril (PRINIVIL,ZESTRIL) 5 MG tablet Take 5 mg by mouth daily.    [provider]  metoprolol (LOPRESSOR) 50 MG tablet Take 50 mg by mouth 2 (two) times daily.    [provider]  predniSONE (DELTASONE) 5 MG tablet Take 4 tabs po day1; 3 tabs po day2; 2 tabs po day3; 1 tabs po day4,5 03/08/15   Alford Highland, MD  saxagliptin HCl (ONGLYZA) 2.5 MG TABS tablet Take 5 mg by mouth daily.    [provider]    Physical Exam: Vitals:   01/07/19 1430 01/07/19 1445 01/07/19 1500 01/07/19 1515  BP:   (!) 183/98   Pulse:  88  91  Resp: (!) 23 (!) 28  (!) 27  Temp:      TempSrc:      SpO2:  94%    Weight:      Height:       General: Not in acute distress HEENT:       Eyes: PERRL, EOMI, no scleral icterus.       ENT: No discharge from the ears and nose, no pharynx injection, no tonsillar enlargement.        Neck: No JVD, no bruit, no mass felt. Heme: No neck lymph node enlargement. Cardiac: S1/S2, RRR, No murmurs, No gallops or rubs. Respiratory:  No rales, wheezing, rhonchi or rubs. GI: Soft, nondistended, nontender, no rebound pain, no organomegaly, BS present. GU: No hematuria Ext: No pitting leg edema bilaterally. 2+DP/PT pulse bilaterally. Musculoskeletal: No joint deformities, No joint redness or warmth, no limitation of ROM in spin. Skin: No rashes.  Neuro: Alert, oriented X3, cranial nerves II-XII grossly intact, moves all extremities. Psych: Patient is not psychotic, no suicidal or hemocidal ideation.  Labs on Admission: I have personally reviewed following labs and imaging studies  CBC: Recent Labs  Lab 01/07/19 1233  WBC 7.2  NEUTROABS 5.3  HGB 13.1  HCT 40.4  MCV 83.8  PLT 144*   Basic Metabolic Panel: Recent Labs  Lab 01/07/19 1232  NA 136  K 3.3*  CL 92*  CO2 28  GLUCOSE 361*  BUN 22  CREATININE 1.93*  CALCIUM 7.9*   GFR: Estimated Creatinine  Clearance: 44.1 mL/min (A) (by C-G formula based on SCr of 1.93 mg/dL (H)). Liver Function Tests: Recent Labs  Lab 01/07/19 1232  AST 71*  ALT 33  ALKPHOS 171*  BILITOT 0.9  PROT 7.3  ALBUMIN 4.0   No results for input(s): LIPASE, AMYLASE in the last 168 hours. No results for input(s): AMMONIA in the last 168 hours. Coagulation Profile: No results for input(s): INR, PROTIME in the last 168 hours. Cardiac Enzymes: No results for input(s): CKTOTAL, CKMB, CKMBINDEX, TROPONINI in the last 168 hours. BNP (last 3 results) No results for input(s): PROBNP in the last 8760 hours. HbA1C: No results for input(s): HGBA1C in the last 72  hours. CBG: No results for input(s): GLUCAP in the last 168 hours. Lipid Profile: Recent Labs    01/07/19 1233  TRIG 109   Thyroid Function Tests: No results for input(s): TSH, T4TOTAL, FREET4, T3FREE, THYROIDAB in the last 72 hours. Anemia Panel: Recent Labs    01/07/19 1233  FERRITIN 205   Urine analysis:    Component Value Date/Time   COLORURINE YELLOW (A) 03/04/2015 1015   APPEARANCEUR CLEAR (A) 03/04/2015 1015   LABSPEC 1.007 03/04/2015 1015   PHURINE 5.0 03/04/2015 1015   GLUCOSEU >500 (A) 03/04/2015 1015   HGBUR NEGATIVE 03/04/2015 1015   BILIRUBINUR NEGATIVE 03/04/2015 1015   KETONESUR NEGATIVE 03/04/2015 1015   PROTEINUR NEGATIVE 03/04/2015 1015   NITRITE NEGATIVE 03/04/2015 1015   LEUKOCYTESUR NEGATIVE 03/04/2015 1015   Sepsis Labs: @LABRCNTIP (procalcitonin:4,lacticidven:4) )No results found for this or any previous visit (from the past 240 hour(s)).   Radiological Exams on Admission: DG Chest Port 1 View  Result Date: 01/07/2019 CLINICAL DATA:  Shortness of breath, COVID-19 EXAM: PORTABLE CHEST 1 VIEW COMPARISON:  Portable exam 1222 hours compared to 03/05/2015 FINDINGS: Enlargement of cardiac silhouette post MVR. Prominent mediastinum accentuated by portable technique. Patchy BILATERAL pulmonary infiltrates consistent with  multifocal pneumonia and history of COVID-19. No pleural effusion or pneumothorax. Bones unremarkable. IMPRESSION: Enlargement of cardiac silhouette post MVR. Patchy BILATERAL pulmonary infiltrates consistent with multifocal pneumonia and history of COVID-19. Electronically Signed   By: 05/05/2015 M.D.   On: 01/07/2019 12:52     EKG: Independently reviewed.  Sinus rhythm, QTC 512, low voltage, LAD, poor R wave progression, anteroseptal infarction pattern.   Assessment/Plan Principal Problem:   Acute respiratory disease due to COVID-19 virus Active Problems:   Chronic diastolic CHF (congestive heart failure) (HCC)   Hyperlipidemia   HTN (hypertension)   Type II diabetes mellitus with renal manifestations (HCC)   Acute renal failure superimposed on stage 3a chronic kidney disease (HCC)   Hypokalemia   Acute respiratory disease due to COVID-19 virus: Patient has oxygen desaturation to 81% on room air.  Positive chest x-ray with bilateral infiltration.   -will admit to med-surg bed as inpt -Remdesivir per pharm -Solumedrol 60 mg bid -vitamin C, zinc.   -Atrovent inhaler, PRN albuterol inhaler -PRN Mucinex for cough -f/u Blood culture -Gentle IV fluid:  -D-dimer, BNP,Trop, LFT, CRP, LDH, Procalcitonin, Ferritin, fibinogen, TG, Hep B SAg, HIV ab -Daily CRP, Ferritin, D-dimer, -Will ask the patient to maintain an awake prone position for 16+ hours a day, if possible, with a minimum of 2-3 hours at a time -Will attempt to maintain euvolemia to a net negative fluid status -IF patient deteriorates, will consult PCCM and ID  Chronic diastolic CHF (congestive heart failure) (HCC): 2D echo on 03/03/2015 showed EF 65%.  Patient does not have leg edema no pulmonary edema chest x-ray.  CHF is compensated. -Check BNP -hold lasix due to worsening renal function  Hyperlipidemia: -Crestor  HTN (hypertension): -hold Cozarr due to worsening renal function -Continue Coreg -As needed  hydralazine  Type II diabetes mellitus with renal manifestations (HCC): Last A1c , poorly fairly well controled. Patient is taking Amaryl, glipizide, Lantus at home -will decrease lantus dose from 30 to 25 unit -SSI  Acute renal failure superimposed on stage 3a chronic kidney disease (HCC): Baseline creatinine 1.5, his creatinine 1.92, BUN 22 -Hold Lasix and cozarr -IV fluid: Patient received 1 L normal saline in the ED.   Hypokalemia: K= 3.3 on admission. - Repleted - Check Mg  level     Inpatient status:  # Patient requires inpatient status due to high intensity of service, high risk for further deterioration and high frequency of surveillance required.  I certify that at the point of admission it is my clinical judgment that the patient will require inpatient hospital care spanning beyond 2 midnights from the point of admission.  . This patient has multiple chronic comorbidities including hypertension, hyperlipidemia, diabetes mellitus, dCHF, CKD stage III . Now patient has presenting with acute respiratory disease due to COVID-19 virus with hypoxia, worsening renal function . The initial radiographic and laboratory data are worrisome because of worsening renal function, bilateral infiltration on chest x-ray, hypokalemia. . Current medical needs: please see my assessment and plan Predictability of an adverse outcome (risk): Patient has multiple comorbidities as listed above. Now presenting with acute respiratory disease due to COVID-19 virus with hypoxia, worsening renal function. Patient's presentation is highly complicated.  Patient is at high risk of deteriorating.  Will need to be treated in hospital for at least 2 days.     DVT ppx: SQ Lovenox Code Status: Full code Family Communication: None at bed side.   Disposition Plan:  Anticipate discharge back to previous home environment Consults called:  none Admission status: Med-surg bed as inpt     Date of Service 01/07/2019     Lorretta Harp [facility]   If 7PM-7AM, please contact night-coverage [email or web address] Password [facility] 01/07/2019, 5:23 PM

## 2019-01-08 ENCOUNTER — Telehealth: Payer: Self-pay | Admitting: Emergency Medicine

## 2019-01-08 DIAGNOSIS — U071 COVID-19: Principal | ICD-10-CM | POA: Diagnosis present

## 2019-01-08 DIAGNOSIS — J069 Acute upper respiratory infection, unspecified: Secondary | ICD-10-CM

## 2019-01-08 LAB — GLUCOSE, CAPILLARY
Glucose-Capillary: 374 mg/dL — ABNORMAL HIGH (ref 70–99)
Glucose-Capillary: 333 mg/dL — ABNORMAL HIGH (ref 70–99)
Glucose-Capillary: 301 mg/dL — ABNORMAL HIGH (ref 70–99)
Glucose-Capillary: 401 mg/dL — ABNORMAL HIGH (ref 70–99)
Glucose-Capillary: 461 mg/dL — ABNORMAL HIGH (ref 70–99)
Glucose-Capillary: 465 mg/dL — ABNORMAL HIGH (ref 70–99)

## 2019-01-08 LAB — COMPREHENSIVE METABOLIC PANEL
ALT: 127 U/L — ABNORMAL HIGH (ref 0–44)
AST: 166 U/L — ABNORMAL HIGH (ref 15–41)
Albumin: 3.9 g/dL (ref 3.5–5.0)
Alkaline Phosphatase: 165 U/L — ABNORMAL HIGH (ref 38–126)
Anion gap: 10 (ref 5–15)
BUN: 26 mg/dL — ABNORMAL HIGH (ref 8–23)
CO2: 31 mmol/L (ref 22–32)
Calcium: 8 mg/dL — ABNORMAL LOW (ref 8.9–10.3)
Chloride: 95 mmol/L — ABNORMAL LOW (ref 98–111)
Creatinine, Ser: 1.74 mg/dL — ABNORMAL HIGH (ref 0.61–1.24)
GFR calc Af Amer: 47 mL/min — ABNORMAL LOW (ref >60)
GFR calc non Af Amer: 41 mL/min — ABNORMAL LOW (ref >60)
Glucose, Bld: 337 mg/dL — ABNORMAL HIGH (ref 70–99)
Potassium: 3.7 mmol/L (ref 3.5–5.1)
Sodium: 136 mmol/L (ref 135–145)
Total Bilirubin: 0.7 mg/dL (ref 0.3–1.2)
Total Protein: 7 g/dL (ref 6.5–8.1)

## 2019-01-08 LAB — CBC WITH DIFFERENTIAL/PLATELET
Abs Immature Granulocytes: 0.02 10*3/uL (ref 0.00–0.07)
Basophils Absolute: 0 10*3/uL (ref 0.0–0.1)
Basophils Relative: 0 %
Eosinophils Absolute: 0 10*3/uL (ref 0.0–0.5)
Eosinophils Relative: 0 %
HCT: 39.2 % (ref 39.0–52.0)
Hemoglobin: 12.6 g/dL — ABNORMAL LOW (ref 13.0–17.0)
Immature Granulocytes: 0 %
Lymphocytes Relative: 12 %
Lymphs Abs: 0.6 10*3/uL — ABNORMAL LOW (ref 0.7–4.0)
MCH: 27.5 pg (ref 26.0–34.0)
MCHC: 32.1 g/dL (ref 30.0–36.0)
MCV: 85.6 fL (ref 80.0–100.0)
Monocytes Absolute: 0.2 10*3/uL (ref 0.1–1.0)
Monocytes Relative: 3 %
Neutro Abs: 4.2 10*3/uL (ref 1.7–7.7)
Neutrophils Relative %: 85 %
Platelets: 138 10*3/uL — ABNORMAL LOW (ref 150–400)
RBC: 4.58 MIL/uL (ref 4.22–5.81)
RDW: 14.1 % (ref 11.5–15.5)
WBC: 5 10*3/uL (ref 4.0–10.5)
nRBC: 0 % (ref 0.0–0.2)

## 2019-01-08 LAB — FERRITIN: Ferritin: 382 ng/mL — ABNORMAL HIGH (ref 24–336)

## 2019-01-08 LAB — HEMOGLOBIN A1C
Hgb A1c MFr Bld: 11.5 % — ABNORMAL HIGH (ref 4.8–5.6)
Mean Plasma Glucose: 283.35 mg/dL

## 2019-01-08 LAB — FIBRIN DERIVATIVES D-DIMER (ARMC ONLY): Fibrin derivatives D-dimer (ARMC): 913.58 ng/mL (FEU) — ABNORMAL HIGH (ref 0.00–499.00)

## 2019-01-08 LAB — MAGNESIUM: Magnesium: 1.9 mg/dL (ref 1.7–2.4)

## 2019-01-08 MED ORDER — INSULIN GLARGINE 100 UNIT/ML ~~LOC~~ SOLN
25.0000 [IU] | Freq: Every day | SUBCUTANEOUS | Status: DC
  Administered 2019-01-08: 14:00:00 25 [IU] via SUBCUTANEOUS
  Filled 2019-01-08 (×2): qty 0.25

## 2019-01-08 MED ORDER — ROSUVASTATIN CALCIUM 20 MG PO TABS
40.0000 mg | ORAL_TABLET | Freq: Every day | ORAL | Status: DC
  Administered 2019-01-08 – 2019-01-09 (×2): 40 mg via ORAL
  Filled 2019-01-08 (×2): qty 2

## 2019-01-08 MED ORDER — INSULIN ASPART 100 UNIT/ML ~~LOC~~ SOLN
0.0000 [IU] | Freq: Three times a day (TID) | SUBCUTANEOUS | Status: DC
  Administered 2019-01-09 (×2): 20 [IU] via SUBCUTANEOUS
  Filled 2019-01-08 (×2): qty 1

## 2019-01-08 MED ORDER — INSULIN ASPART 100 UNIT/ML ~~LOC~~ SOLN
12.0000 [IU] | Freq: Once | SUBCUTANEOUS | Status: AC
  Administered 2019-01-08: 12 [IU] via SUBCUTANEOUS

## 2019-01-08 MED ORDER — INSULIN GLARGINE 100 UNIT/ML ~~LOC~~ SOLN
30.0000 [IU] | Freq: Every day | SUBCUTANEOUS | Status: DC
  Administered 2019-01-09: 30 [IU] via SUBCUTANEOUS
  Filled 2019-01-08: qty 0.3

## 2019-01-08 MED ORDER — AMIODARONE HCL 200 MG PO TABS
200.0000 mg | ORAL_TABLET | Freq: Every day | ORAL | Status: DC
  Administered 2019-01-08 – 2019-01-09 (×2): 200 mg via ORAL
  Filled 2019-01-08 (×2): qty 1

## 2019-01-08 MED ORDER — ASPIRIN 81 MG PO TBEC
81.0000 mg | DELAYED_RELEASE_TABLET | Freq: Every day | ORAL | Status: DC
  Administered 2019-01-08 – 2019-01-09 (×2): 81 mg via ORAL
  Filled 2019-01-08 (×3): qty 1

## 2019-01-08 MED ORDER — CARVEDILOL 25 MG PO TABS
50.0000 mg | ORAL_TABLET | Freq: Two times a day (BID) | ORAL | Status: DC
  Administered 2019-01-08 – 2019-01-09 (×3): 50 mg via ORAL
  Filled 2019-01-08 (×5): qty 2

## 2019-01-08 MED ORDER — INSULIN ASPART 100 UNIT/ML ~~LOC~~ SOLN
SUBCUTANEOUS | Status: AC
  Filled 2019-01-08: qty 1

## 2019-01-08 NOTE — ED Notes (Signed)
Pt asleep. Bed locked low. Rail up. Call bell within reach.

## 2019-01-08 NOTE — ED Notes (Signed)
Lab, Yenni, called for blood draw assist

## 2019-01-08 NOTE — Progress Notes (Signed)
Inpatient Diabetes Program Recommendations  AACE/ADA: New Consensus Statement on Inpatient Glycemic Control (2015)  Target Ranges:  Prepandial:   less than 140 mg/dL      Peak postprandial:   less than 180 mg/dL (1-2 hours)      Critically ill patients:  140 - 180 mg/dL   Lab Results  Component Value Date   GLUCAP 301 (H) 01/08/2019   HGBA1C 7.5 (H) 03/03/2015    Review of Glycemic Control  Results for MAJESTY, OEHLERT (MRN [id number]) as of 01/08/2019 09:19  Ref. Range 01/08/2019 01:32 01/08/2019 04:39 01/08/2019 08:36  Glucose-Capillary Latest Ref Range: 70 - 99 mg/dL 797 (H) 282 (H) 060 (H)    Diabetes history: DM2 Outpatient Diabetes medications: Amaryl 4 mg daily; glipizide 2 mg BID; Lantus 30 units QHS Current orders for Inpatient glycemic control: Lantus 25 units QHS; Novolog 0-9 Q4H; Solumedrol 60 mg BID  Inpatient Diabetes Program Recommendations:     -Administer Lantus ASAP  -Novolog 0-15 Q4H -Novolog 4 units TID with meals if eats at least 50% of meal  Thank you, Zerita Boers, RN, BSN Diabetes Coordinator Inpatient Diabetes Program [phone] (team pager from 8a-5p)

## 2019-01-08 NOTE — TOC Progression Note (Signed)
Transition of Care [facility]) - Progression Note    Patient Details  Name: [name] MRN: [id number] Date of Birth: 1955-02-17  Transition of Care [facility] CM/SW Contact  Monteagle Cellar, RN Phone Number: 01/08/2019, 2:31 PM  Clinical Narrative:    Faxed requested information to [facility] for transfer to [facility] hospital. Daughter is aware of transfer.         Expected Discharge Plan and Services                                                 Social Determinants of Health (SDOH) Interventions    Readmission Risk Interventions No flowsheet data found.

## 2019-01-08 NOTE — ED Notes (Signed)
Resp therapy states bc pt at 100% on high flow heated cannula he cannot be switched to CPAP. States pt should remain on high flow cannula as is. Brandy RN on 1C notified.

## 2019-01-08 NOTE — ED Notes (Signed)
Pt resting quietly. No signs of distress at this time. Pt has a meal tray for when he wakes up.

## 2019-01-08 NOTE — ED Notes (Signed)
Rounding provider Jon Billings notified pt reports using CPAP at night at home. Order requested.

## 2019-01-08 NOTE — ED Notes (Signed)
Respiratory on the way to place pt back on bipap

## 2019-01-08 NOTE — ED Notes (Signed)
Pt given phone to talk to daughter.

## 2019-01-08 NOTE — Consult Note (Addendum)
Remdesivir - Pharmacy Brief Note  Remdesivir Day 2   O:  ALT: 33 >> 127.  CXR: Patchy BILATERAL pulmonary infiltrates consistent with multifocal pneumonia SpO2: 81% on room air    A/P:  Remdesivir 200 mg IVPB once followed by 100 mg IVPB daily x 4 days.   Gardner Candle, PharmD, BCPS Clinical Pharmacist 01/08/2019 12:54 PM

## 2019-01-08 NOTE — Discharge Summary (Addendum)
Physician Discharge Summary   [name]  male DOB: 1955/09/06  UYQ:[id number]  PCP: Sherron Monday, MD  Admit date: 01/07/2019 Discharge date: 01/09/2019  Admitted From: home [facility] CODE STATUS: Full code  Pt's daughter updated on the plans.   Hospital Course:  For full details, please see H&P, progress notes, consult notes and ancillary notes.  Briefly,  Randy Chance Lynchis a 64 y.o.AA malewith medical history significant ofhypertension, hyperlipidemia, diabetes mellitus,dCHF, CKD stage III, who presented with a cough, shortness breath.    Patient has been having cough, shortness of breath for more than 4 days prior to presentation.  He had a positive COVID-19 test resulted on 01/06/19 as outpatient.  Pt on NRB mask upon arrival.    ED Course: pt was found to have WBC 7.2, lactic acid 1.2, potassium 3.3, worsening renal function, temperature normal, blood pressure 136/89, heart rate 88, tachypnea, chest x-ray showed bilateral infiltration and cardiomegaly.    Acute hypoxic respiratory failure due to COVID-19 PNA On presentation, Patient oxygen desaturation to 81% on room air. Positive chest x-ray with bilateral infiltration.  O2 requirement had increased, needing opti-flow at 40L.  Pt was found to have apnea event while sleeping, so was switched to BiPAP (with double filters) by RT.  Pt was started on Remdesivir on 01/07/19, and solu-medrol 60 mg BID.  Also ordered Vitamin C, zinc,Atrovent inhaler scheduled q4h, PRN albuterol inhaler, PRN Mucinex for cough.  Pt was waiting on a bed to stepdown unit in [place], however, family wanted pt to be transferred to [place] at [facility], which has been initiated, pending bed availability.    Acute renal failure superimposed on stage 3a chronic kidney disease (HCC), improved On presentation, his creatinine 1.92, BUN 22.  Baseline creatinine 1.5.  Patient received 1 L normal saline in the ED with improvement in Cr.  Home Lasix  andcozaar held 2/2 AKI.  Cr 1.73 on the day of discharge/transfer.  Chronic diastolic CHF (congestive heart failure) (HCC), stable 2D echo on 03/03/2015 showed EF 65%. Patient does not have leg edema nor pulmonary edema chest x-ray. Home lasix held due toworsening renal function.  Hyperlipidemia Continued Crestor  HTN (hypertension) Home Cozarrheld due to worsening renal function.   Continued homeCoreg.  # Type II diabetes mellitus with renal manifestations (HCC) # Hyperglycemia 2/2 steroid use Patient is takingAmaryl, glipizide, Lantus at home No recent A1c.  Home oral agents held, and pt continued on lantusat 30 daily, and SSI q4h, however, BG continued to be elevated, so insulin may need to be titrated up while on steroids.  Hypokalemia, resolved K=3.3on admission.  Repleted   Discharge Diagnoses:  Principal Problem:   Acute respiratory disease due to COVID-19 virus Active Problems:   Chronic diastolic CHF (congestive heart failure) (HCC)   Hyperlipidemia   HTN (hypertension)   Type II diabetes mellitus with renal manifestations (HCC)   Acute renal failure superimposed on stage 3a chronic kidney disease (HCC)   Hypokalemia   Pneumonia due to COVID-19 virus    Discharge Instructions:  Allergies as of 01/09/2019      Reactions   Atorvastatin Other (See Comments)   Canagliflozin Other (See Comments)   Note: tingling in legs/toes      Medication List    TAKE these medications   albuterol 108 (90 Base) MCG/ACT inhaler Commonly known as: VENTOLIN HFA Inhale 2 puffs into the lungs every 4 (four) hours as needed for wheezing or shortness of breath.  ascorbic acid 500 MG tablet Commonly known as: VITAMIN C Take 1 tablet (500 mg total) by mouth daily.   aspirin EC 81 MG tablet Take 81 mg by mouth daily.   carvedilol 25 MG tablet Commonly known as: COREG Take 50 mg by mouth 2 (two) times daily.   dextromethorphan-guaiFENesin 30-600 MG 12hr  tablet Commonly known as: MUCINEX DM Take 1 tablet by mouth 2 (two) times daily.   glimepiride 4 MG tablet Commonly known as: AMARYL Take 4 mg by mouth daily.   glipiZIDE 10 MG 24 hr tablet Commonly known as: GLUCOTROL XL Take 10 mg by mouth 2 (two) times daily.   ipratropium 17 MCG/ACT inhaler Commonly known as: ATROVENT HFA Inhale 2 puffs into the lungs every 4 (four) hours.   Lantus SoloStar 100 UNIT/ML Solostar Pen Generic drug: Insulin Glargine Inject 30 Units into the skin at bedtime.   losartan 50 MG tablet Commonly known as: COZAAR Hold due to AKI What changed:   how much to take  how to take this  when to take this  additional instructions   methylPREDNISolone sodium succinate 125 mg/2 mL injection Commonly known as: SOLU-MEDROL Inject 0.96 mLs (60 mg total) into the vein every 12 (twelve) hours.   Pacerone 200 MG tablet Generic drug: amiodarone Take 200 mg by mouth daily.   rosuvastatin 40 MG tablet Commonly known as: CRESTOR Take 40 mg by mouth daily.   tadalafil 20 MG tablet Commonly known as: CIALIS Take 20 mg by mouth as needed.   torsemide 20 MG tablet Commonly known as: DEMADEX Hold due to AKI What changed:   how much to take  how to take this  when to take this  additional instructions   zinc sulfate 220 (50 Zn) MG capsule Take 1 capsule (220 mg total) by mouth daily.       The results of significant diagnostics from this hospitalization (including imaging, microbiology, ancillary and laboratory) are listed below for reference.   Consultations:  none   Procedures/Studies: DG Chest Port 1 View  Result Date: 01/07/2019 CLINICAL DATA:  Shortness of breath, COVID-19 EXAM: PORTABLE CHEST 1 VIEW COMPARISON:  Portable exam 1222 hours compared to 03/05/2015 FINDINGS: Enlargement of cardiac silhouette post MVR. Prominent mediastinum accentuated by portable technique. Patchy BILATERAL pulmonary infiltrates consistent with multifocal  pneumonia and history of COVID-19. No pleural effusion or pneumothorax. Bones unremarkable. IMPRESSION: Enlargement of cardiac silhouette post MVR. Patchy BILATERAL pulmonary infiltrates consistent with multifocal pneumonia and history of COVID-19. Electronically Signed   By: Lavonia Dana M.D.   On: 01/07/2019 12:52      Labs: BNP (last 3 results) Recent Labs    01/07/19 1525  BNP 338.2*   Basic Metabolic Panel: Recent Labs  Lab 01/07/19 1232 01/08/19 0841  NA 136 136  K 3.3* 3.7  CL 92* 95*  CO2 28 31  GLUCOSE 361* 337*  BUN 22 26*  CREATININE 1.93* 1.74*  CALCIUM 7.9* 8.0*  MG  --  1.9   Liver Function Tests: Recent Labs  Lab 01/07/19 1232 01/08/19 0841  AST 71* 166*  ALT 33 127*  ALKPHOS 171* 165*  BILITOT 0.9 0.7  PROT 7.3 7.0  ALBUMIN 4.0 3.9   No results for input(s): LIPASE, AMYLASE in the last 168 hours. No results for input(s): AMMONIA in the last 168 hours. CBC: Recent Labs  Lab 01/07/19 1233 01/08/19 0841  WBC 7.2 5.0  NEUTROABS 5.3 4.2  HGB 13.1 12.6*  HCT 40.4 39.2  MCV 83.8 85.6  PLT 144* 138*   Cardiac Enzymes: No results for input(s): CKTOTAL, CKMB, CKMBINDEX, TROPONINI in the last 168 hours. BNP: Invalid input(s): POCBNP CBG: Recent Labs  Lab 01/08/19 0132 01/08/19 0439 01/08/19 0836 01/08/19 1416 01/08/19 1640  GLUCAP 374* 333* 301* 401* 461*   D-Dimer Recent Labs    01/07/19 1345  DDIMER 2.61*   Hgb A1c No results for input(s): HGBA1C in the last 72 hours. Lipid Profile Recent Labs    01/07/19 1233  TRIG 109   Thyroid function studies No results for input(s): TSH, T4TOTAL, T3FREE, THYROIDAB in the last 72 hours.  Invalid input(s): FREET3 Anemia work up Recent Labs    01/07/19 1233 01/08/19 0841  FERRITIN 205 382*   Urinalysis    Component Value Date/Time   COLORURINE YELLOW (A) 03/04/2015 1015   APPEARANCEUR CLEAR (A) 03/04/2015 1015   LABSPEC 1.007 03/04/2015 1015   PHURINE 5.0 03/04/2015 1015    GLUCOSEU >500 (A) 03/04/2015 1015   HGBUR NEGATIVE 03/04/2015 1015   BILIRUBINUR NEGATIVE 03/04/2015 1015   KETONESUR NEGATIVE 03/04/2015 1015   PROTEINUR NEGATIVE 03/04/2015 1015   NITRITE NEGATIVE 03/04/2015 1015   LEUKOCYTESUR NEGATIVE 03/04/2015 1015   Sepsis Labs Invalid input(s): PROCALCITONIN,  WBC,  LACTICIDVEN Microbiology Recent Results (from the past 240 hour(s))  Culture, blood (Routine X 2) w Reflex to ID Panel     Status: None (Preliminary result)   Collection Time: 01/07/19  8:02 PM   Specimen: BLOOD  Result Value Ref Range Status   Specimen Description BLOOD BLOOD LEFT HAND  Final   Special Requests   Final    BOTTLES DRAWN AEROBIC AND ANAEROBIC Blood Culture adequate volume   Culture   Final    NO GROWTH < 12 HOURS Performed at [facility], [address], [place], [place] [address]    Report Status PENDING  Incomplete  Culture, blood (Routine X 2) w Reflex to ID Panel     Status: None (Preliminary result)   Collection Time: 01/07/19  8:02 PM   Specimen: BLOOD  Result Value Ref Range Status   Specimen Description BLOOD LEFT ARM  Final   Special Requests   Final    BOTTLES DRAWN AEROBIC ONLY Blood Culture results may not be optimal due to an inadequate volume of blood received in culture bottles   Culture   Final    NO GROWTH < 12 HOURS Performed at [facility], [address], [place], [place] [address]    Report Status PENDING  Incomplete     Total time spend on discharging this patient, including the last patient exam, discussing the hospital stay, instructions for ongoing care as it relates to all pertinent caregivers, as well as preparing the medical discharge records, prescriptions, and/or referrals as applicable, is 30 minutes.    Darlin Priestly, MD  [facility] 01/08/2019, 8:00 PM  If 7PM-7AM, please contact night-coverage

## 2019-01-08 NOTE — ED Notes (Signed)
Resp therapy called to assist in pt transfer process. RT states they will be at bedside momentarily.

## 2019-01-08 NOTE — Progress Notes (Signed)
PROGRESS NOTE    [name]  FYB:[id number] DOB: 1955-05-15 DOA: 01/07/2019 PCP: [name], MD    Assessment & Plan:   Principal Problem:   Acute respiratory disease due to COVID-19 virus Active Problems:   Chronic diastolic CHF (congestive heart failure) (HCC)   Hyperlipidemia   HTN (hypertension)   Type II diabetes mellitus with renal manifestations (HCC)   Acute renal failure superimposed on stage 3a chronic kidney disease (HCC)   Hypokalemia   Pneumonia due to COVID-19 virus    [name] is a 64 y.o. AA male with medical history significant of hypertension, hyperlipidemia, diabetes mellitus, dCHF, CKD stage III, who presented with a cough, shortness breath.    Acute respiratory disease due to COVID-19 PNA:  --On presentation, Patient oxygen desaturation to 81% on room air.  Positive chest x-ray with bilateral infiltration.   --O2 requirement had increased, needing opti-flow at 40L PLAN: -continue Remdesivir  -continue Solumedrol 60 mg bid -vitamin C, zinc.  -Atrovent inhaler, PRN albuterol inhaler -PRN Mucinex for cough -Switch to BiPAP due to pt's sleep apnea, per RT rec. --Changed admission order to stepdown unit --Also being considered for transfer to [place] at [facility].  Chronic diastolic CHF (congestive heart failure) ([place]), stable 2D echo on 03/03/2015 showed EF 65%.  Patient does not have leg edema no pulmonary edema chest x-ray.  CHF is compensated. -hold lasix due to worsening renal function  Hyperlipidemia: -Crestor  HTN (hypertension): -hold Cozarr due to worsening renal function -Continue Coreg -As needed hydralazine  Type II diabetes mellitus with renal manifestations [facility]):  Patient is taking Amaryl, glipizide, Lantus at home --A1c -continue lantus at 30 daily -SSI q4h  Acute renal failure superimposed on stage 3a chronic kidney disease ([place]), improved Baseline creatinine 1.5, his creatinine 1.92, BUN 22 -IV fluid: Patient  received 1 L normal saline in the ED. -Hold Lasix and cozarr  Hypokalemia, resolved K= 3.3 on admission. - Repleted   DVT prophylaxis: Lovenox SQ Code Status: Full code  Family Communication: updated daughter on the phone Disposition Plan: need to go to stepdown unit, within [place], or [place] at [facility].   Subjective and Interval History:  Pt reported feeling fine, but has been sleeping continuously, but does wake up periodically to eat.  No fever, chest pain, abdominal pain, N/V/D.  Daughter wanted pt to be transferred to [place] at [facility].  Case manager involved in coordinating this.  Pt is currently on opti-flow at [place], so will need stepdown (intermiedate care) level of care.    Objective: Vitals:   01/08/19 1537 01/08/19 1622 01/08/19 1728 01/08/19 1800  BP:   129/84 108/74  Pulse:  90 92 92  Resp:  19 15 16   Temp:      TempSrc:      SpO2: 91% 95% 97% 96%  Weight:      Height:        Intake/Output Summary (Last 24 hours) at 01/08/2019 1917 Last data filed at 01/08/2019 0842 Gross per 24 hour  Intake --  Output 1425 ml  Net -1425 ml   Filed Weights   01/07/19 1236  Weight: 99.8 kg    Examination:   Constitutional: NAD, AAOx3, sleepy HEENT: conjunctivae and lids normal, EOMI CV: RRR no M,R,G. Distal pulses +2.  No cyanosis.   RESP: CTA B/L over anterior, on opti-flow at 40L GI: +BS, NTND Extremities: No effusions, edema, or tenderness in BLE SKIN: warm, dry and intact Neuro: II - XII grossly intact.  Sensation intact   Data Reviewed: I have personally reviewed following labs and imaging studies  CBC: Recent Labs  Lab 01/07/19 1233 01/08/19 0841  WBC 7.2 5.0  NEUTROABS 5.3 4.2  HGB 13.1 12.6*  HCT 40.4 39.2  MCV 83.8 85.6  PLT 144* 138*   Basic Metabolic Panel: Recent Labs  Lab 01/07/19 1232 01/08/19 0841  NA 136 136  K 3.3* 3.7  CL 92* 95*  CO2 28 31  GLUCOSE 361* 337*  BUN 22 26*  CREATININE 1.93* 1.74*  CALCIUM 7.9* 8.0*  MG  --  1.9    GFR: Estimated Creatinine Clearance: 48.9 mL/min (A) (by C-G formula based on SCr of 1.74 mg/dL (H)). Liver Function Tests: Recent Labs  Lab 01/07/19 1232 01/08/19 0841  AST 71* 166*  ALT 33 127*  ALKPHOS 171* 165*  BILITOT 0.9 0.7  PROT 7.3 7.0  ALBUMIN 4.0 3.9   No results for input(s): LIPASE, AMYLASE in the last 168 hours. No results for input(s): AMMONIA in the last 168 hours. Coagulation Profile: No results for input(s): INR, PROTIME in the last 168 hours. Cardiac Enzymes: No results for input(s): CKTOTAL, CKMB, CKMBINDEX, TROPONINI in the last 168 hours. BNP (last 3 results) No results for input(s): PROBNP in the last 8760 hours. HbA1C: No results for input(s): HGBA1C in the last 72 hours. CBG: Recent Labs  Lab 01/08/19 0132 01/08/19 0439 01/08/19 0836 01/08/19 1416 01/08/19 1640  GLUCAP 374* 333* 301* 401* 461*   Lipid Profile: Recent Labs    01/07/19 1233  TRIG 109   Thyroid Function Tests: No results for input(s): TSH, T4TOTAL, FREET4, T3FREE, THYROIDAB in the last 72 hours. Anemia Panel: Recent Labs    01/07/19 1233 01/08/19 0841  FERRITIN 205 382*   Sepsis Labs: Recent Labs  Lab 01/07/19 1233 01/07/19 1432  PROCALCITON 0.91  --   LATICACIDVEN 1.2 2.5*    Recent Results (from the past 240 hour(s))  Culture, blood (Routine X 2) w Reflex to ID Panel     Status: None (Preliminary result)   Collection Time: 01/07/19  8:02 PM   Specimen: BLOOD  Result Value Ref Range Status   Specimen Description BLOOD BLOOD LEFT HAND  Final   Special Requests   Final    BOTTLES DRAWN AEROBIC AND ANAEROBIC Blood Culture adequate volume   Culture   Final    NO GROWTH < 12 HOURS Performed at [facility], [address], [place], [place] [address]    Report Status PENDING  Incomplete  Culture, blood (Routine X 2) w Reflex to ID Panel     Status: None (Preliminary result)   Collection Time: 01/07/19  8:02 PM   Specimen: BLOOD  Result Value Ref  Range Status   Specimen Description BLOOD LEFT ARM  Final   Special Requests   Final    BOTTLES DRAWN AEROBIC ONLY Blood Culture results may not be optimal due to an inadequate volume of blood received in culture bottles   Culture   Final    NO GROWTH < 12 HOURS Performed at [facility], [address], [place], [place] [address]    Report Status PENDING  Incomplete      Radiology Studies: DG Chest Port 1 View  Result Date: 01/07/2019 CLINICAL DATA:  Shortness of breath, COVID-19 EXAM: PORTABLE CHEST 1 VIEW COMPARISON:  Portable exam 1222 hours compared to 03/05/2015 FINDINGS: Enlargement of cardiac silhouette post MVR. Prominent mediastinum accentuated by portable technique. Patchy BILATERAL pulmonary infiltrates  consistent with multifocal pneumonia and history of COVID-19. No pleural effusion or pneumothorax. Bones unremarkable. IMPRESSION: Enlargement of cardiac silhouette post MVR. Patchy BILATERAL pulmonary infiltrates consistent with multifocal pneumonia and history of COVID-19. Electronically Signed   By: Ulyses Southward M.D.   On: 01/07/2019 12:52     Scheduled Meds: . amiodarone  200 mg Oral Daily  . vitamin C  500 mg Oral Daily  . aspirin EC  81 mg Oral Daily  . carvedilol  50 mg Oral BID  . dextromethorphan-guaiFENesin  1 tablet Oral BID  . enoxaparin (LOVENOX) injection  40 mg Subcutaneous Q24H  . insulin aspart  0-9 Units Subcutaneous Q4H  . insulin glargine  25 Units Subcutaneous Daily  . ipratropium  2 puff Inhalation Q4H  . methylPREDNISolone (SOLU-MEDROL) injection  60 mg Intravenous Q12H  . rosuvastatin  40 mg Oral Daily  . zinc sulfate  220 mg Oral Daily   Continuous Infusions: . remdesivir 100 mg in NS 100 mL Stopped (01/08/19 1126)     LOS: 1 day     Darlin Priestly, MD [facility] If 7PM-7AM, please contact night-coverage 01/08/2019, 7:17 PM

## 2019-01-09 ENCOUNTER — Encounter (HOSPITAL_COMMUNITY): Payer: Self-pay | Admitting: Internal Medicine

## 2019-01-09 ENCOUNTER — Inpatient Hospital Stay (HOSPITAL_COMMUNITY)
Admission: AD | Admit: 2019-01-09 | Discharge: 2019-01-11 | DRG: 177 | Disposition: A | Discharge status: Home / Self Care | Payer: Medicare HMO | Source: Other Acute Inpatient Hospital | Attending: Internal Medicine | Admitting: Internal Medicine

## 2019-01-09 ENCOUNTER — Other Ambulatory Visit: Payer: Self-pay

## 2019-01-09 DIAGNOSIS — J1282 Pneumonia due to coronavirus disease 2019: Secondary | ICD-10-CM | POA: Diagnosis present

## 2019-01-09 DIAGNOSIS — U071 COVID-19: Secondary | ICD-10-CM | POA: Diagnosis present

## 2019-01-09 DIAGNOSIS — J069 Acute upper respiratory infection, unspecified: Secondary | ICD-10-CM | POA: Diagnosis not present

## 2019-01-09 DIAGNOSIS — N1832 Chronic kidney disease, stage 3b: Secondary | ICD-10-CM | POA: Diagnosis not present

## 2019-01-09 DIAGNOSIS — J9601 Acute respiratory failure with hypoxia: Secondary | ICD-10-CM | POA: Diagnosis present

## 2019-01-09 DIAGNOSIS — N1831 Chronic kidney disease, stage 3a: Secondary | ICD-10-CM | POA: Diagnosis present

## 2019-01-09 DIAGNOSIS — E1121 Type 2 diabetes mellitus with diabetic nephropathy: Secondary | ICD-10-CM | POA: Diagnosis not present

## 2019-01-09 DIAGNOSIS — I480 Paroxysmal atrial fibrillation: Secondary | ICD-10-CM | POA: Diagnosis present

## 2019-01-09 DIAGNOSIS — E1122 Type 2 diabetes mellitus with diabetic chronic kidney disease: Secondary | ICD-10-CM | POA: Diagnosis present

## 2019-01-09 DIAGNOSIS — E785 Hyperlipidemia, unspecified: Secondary | ICD-10-CM | POA: Diagnosis present

## 2019-01-09 DIAGNOSIS — Z794 Long term (current) use of insulin: Secondary | ICD-10-CM | POA: Diagnosis not present

## 2019-01-09 DIAGNOSIS — I5032 Chronic diastolic (congestive) heart failure: Secondary | ICD-10-CM | POA: Diagnosis present

## 2019-01-09 DIAGNOSIS — G4733 Obstructive sleep apnea (adult) (pediatric): Secondary | ICD-10-CM | POA: Diagnosis present

## 2019-01-09 DIAGNOSIS — N179 Acute kidney failure, unspecified: Secondary | ICD-10-CM | POA: Diagnosis present

## 2019-01-09 DIAGNOSIS — I130 Hypertensive heart and chronic kidney disease with heart failure and stage 1 through stage 4 chronic kidney disease, or unspecified chronic kidney disease: Secondary | ICD-10-CM | POA: Diagnosis present

## 2019-01-09 DIAGNOSIS — I10 Essential (primary) hypertension: Secondary | ICD-10-CM | POA: Diagnosis present

## 2019-01-09 LAB — COMPREHENSIVE METABOLIC PANEL
ALT: 140 U/L — ABNORMAL HIGH (ref 0–44)
AST: 118 U/L — ABNORMAL HIGH (ref 15–41)
Albumin: 3.6 g/dL (ref 3.5–5.0)
Alkaline Phosphatase: 161 U/L — ABNORMAL HIGH (ref 38–126)
Anion gap: 10 (ref 5–15)
BUN: 30 mg/dL — ABNORMAL HIGH (ref 8–23)
CO2: 33 mmol/L — ABNORMAL HIGH (ref 22–32)
Calcium: 8.3 mg/dL — ABNORMAL LOW (ref 8.9–10.3)
Chloride: 96 mmol/L — ABNORMAL LOW (ref 98–111)
Creatinine, Ser: 1.73 mg/dL — ABNORMAL HIGH (ref 0.61–1.24)
GFR calc Af Amer: 48 mL/min — ABNORMAL LOW (ref >60)
GFR calc non Af Amer: 41 mL/min — ABNORMAL LOW (ref >60)
Glucose, Bld: 423 mg/dL — ABNORMAL HIGH (ref 70–99)
Potassium: 4.1 mmol/L (ref 3.5–5.1)
Sodium: 139 mmol/L (ref 135–145)
Total Bilirubin: 0.7 mg/dL (ref 0.3–1.2)
Total Protein: 6.8 g/dL (ref 6.5–8.1)

## 2019-01-09 LAB — GLUCOSE, CAPILLARY
Glucose-Capillary: 387 mg/dL — ABNORMAL HIGH (ref 70–99)
Glucose-Capillary: 439 mg/dL — ABNORMAL HIGH (ref 70–99)
Glucose-Capillary: 388 mg/dL — ABNORMAL HIGH (ref 70–99)
Glucose-Capillary: 423 mg/dL — ABNORMAL HIGH (ref 70–99)

## 2019-01-09 LAB — CBC WITH DIFFERENTIAL/PLATELET
Abs Immature Granulocytes: 0.05 10*3/uL (ref 0.00–0.07)
Basophils Absolute: 0 10*3/uL (ref 0.0–0.1)
Basophils Relative: 0 %
Eosinophils Absolute: 0 10*3/uL (ref 0.0–0.5)
Eosinophils Relative: 0 %
HCT: 41.2 % (ref 39.0–52.0)
Hemoglobin: 13 g/dL (ref 13.0–17.0)
Immature Granulocytes: 1 %
Lymphocytes Relative: 7 %
Lymphs Abs: 0.4 10*3/uL — ABNORMAL LOW (ref 0.7–4.0)
MCH: 27.5 pg (ref 26.0–34.0)
MCHC: 31.6 g/dL (ref 30.0–36.0)
MCV: 87.1 fL (ref 80.0–100.0)
Monocytes Absolute: 0.3 10*3/uL (ref 0.1–1.0)
Monocytes Relative: 4 %
Neutro Abs: 5.7 10*3/uL (ref 1.7–7.7)
Neutrophils Relative %: 88 %
Platelets: 140 10*3/uL — ABNORMAL LOW (ref 150–400)
RBC: 4.73 MIL/uL (ref 4.22–5.81)
RDW: 14.1 % (ref 11.5–15.5)
WBC: 6.5 10*3/uL (ref 4.0–10.5)
nRBC: 0 % (ref 0.0–0.2)

## 2019-01-09 LAB — PROCALCITONIN: Procalcitonin: 1.31 ng/mL

## 2019-01-09 LAB — BRAIN NATRIURETIC PEPTIDE: B Natriuretic Peptide: 41 pg/mL (ref 0.0–100.0)

## 2019-01-09 LAB — MAGNESIUM: Magnesium: 2.1 mg/dL (ref 1.7–2.4)

## 2019-01-09 LAB — C-REACTIVE PROTEIN: CRP: 2.8 mg/dL — ABNORMAL HIGH (ref <1.0)

## 2019-01-09 LAB — GLUCOSE, RANDOM: Glucose, Bld: 438 mg/dL — ABNORMAL HIGH (ref 70–99)

## 2019-01-09 LAB — SARS CORONAVIRUS 2 (TAT 6-24 HRS): SARS Coronavirus 2: POSITIVE — AB

## 2019-01-09 LAB — FERRITIN: Ferritin: 461 ng/mL — ABNORMAL HIGH (ref 24–336)

## 2019-01-09 LAB — D-DIMER, QUANTITATIVE: D-Dimer, Quant: 2.41 ug/mL-FEU — ABNORMAL HIGH (ref 0.00–0.50)

## 2019-01-09 LAB — FIBRIN DERIVATIVES D-DIMER (ARMC ONLY): Fibrin derivatives D-dimer (ARMC): 716.31 ng/mL (FEU) — ABNORMAL HIGH (ref 0.00–499.00)

## 2019-01-09 MED ORDER — ZINC SULFATE 220 (50 ZN) MG PO CAPS
220.0000 mg | ORAL_CAPSULE | Freq: Every day | ORAL | Status: DC
  Administered 2019-01-10 – 2019-01-11 (×2): 220 mg via ORAL
  Filled 2019-01-09 (×2): qty 1

## 2019-01-09 MED ORDER — SODIUM CHLORIDE 0.9 % IV SOLN: 250.0000 mL | INTRAVENOUS | Status: DC | PRN

## 2019-01-09 MED ORDER — HYDROCOD POLST-CPM POLST ER 10-8 MG/5ML PO SUER: 5.0000 mL | Freq: Two times a day (BID) | ORAL | Status: DC | PRN

## 2019-01-09 MED ORDER — INSULIN ASPART 100 UNIT/ML ~~LOC~~ SOLN: 8.0000 [IU] | Freq: Three times a day (TID) | SUBCUTANEOUS | Status: DC

## 2019-01-09 MED ORDER — INSULIN ASPART 100 UNIT/ML ~~LOC~~ SOLN
0.0000 [IU] | Freq: Every day | SUBCUTANEOUS | Status: DC
  Administered 2019-01-09: 22:00:00 5 [IU] via SUBCUTANEOUS
  Administered 2019-01-10: 4 [IU] via SUBCUTANEOUS

## 2019-01-09 MED ORDER — ONDANSETRON HCL 4 MG/2ML IJ SOLN: 4.0000 mg | Freq: Four times a day (QID) | INTRAMUSCULAR | Status: DC | PRN

## 2019-01-09 MED ORDER — INSULIN GLARGINE 100 UNIT/ML ~~LOC~~ SOLN
45.0000 [IU] | Freq: Every day | SUBCUTANEOUS | Status: DC
  Filled 2019-01-09: qty 0.45

## 2019-01-09 MED ORDER — RIVAROXABAN 20 MG PO TABS
20.0000 mg | ORAL_TABLET | Freq: Every day | ORAL | Status: DC
  Administered 2019-01-09 – 2019-01-10 (×2): 20 mg via ORAL
  Filled 2019-01-09 (×3): qty 1

## 2019-01-09 MED ORDER — DM-GUAIFENESIN ER 30-600 MG PO TB12: 1.0000 | ORAL_TABLET | Freq: Two times a day (BID) | ORAL | Status: AC

## 2019-01-09 MED ORDER — METHYLPREDNISOLONE SODIUM SUCC 125 MG IJ SOLR: 0.5000 mg/kg | Freq: Two times a day (BID) | INTRAMUSCULAR | Status: DC

## 2019-01-09 MED ORDER — INSULIN ASPART 100 UNIT/ML ~~LOC~~ SOLN
0.0000 [IU] | Freq: Three times a day (TID) | SUBCUTANEOUS | Status: DC
  Administered 2019-01-09: 25 [IU] via SUBCUTANEOUS

## 2019-01-09 MED ORDER — METHYLPREDNISOLONE SODIUM SUCC 40 MG IJ SOLR
40.0000 mg | Freq: Two times a day (BID) | INTRAMUSCULAR | Status: DC
  Administered 2019-01-09 – 2019-01-11 (×4): 40 mg via INTRAVENOUS
  Filled 2019-01-09 (×4): qty 1

## 2019-01-09 MED ORDER — ZINC SULFATE 220 (50 ZN) MG PO CAPS
220.0000 mg | ORAL_CAPSULE | Freq: Every day | ORAL | Status: DC
  Filled 2019-01-09: qty 1

## 2019-01-09 MED ORDER — ALBUTEROL SULFATE HFA 108 (90 BASE) MCG/ACT IN AERS
2.0000 | INHALATION_SPRAY | RESPIRATORY_TRACT | Status: DC | PRN
  Filled 2019-01-09: qty 6.7

## 2019-01-09 MED ORDER — SODIUM CHLORIDE 0.9 % IV SOLN
100.0000 mg | Freq: Every day | INTRAVENOUS | Status: AC
  Administered 2019-01-10 – 2019-01-11 (×2): 100 mg via INTRAVENOUS
  Filled 2019-01-09 (×2): qty 20

## 2019-01-09 MED ORDER — SODIUM CHLORIDE 0.9 % IV SOLN
200.0000 mg | Freq: Once | INTRAVENOUS | Status: DC
  Filled 2019-01-09: qty 40

## 2019-01-09 MED ORDER — SODIUM CHLORIDE 0.9% FLUSH
3.0000 mL | INTRAVENOUS | Status: DC | PRN
  Administered 2019-01-10: 3 mL via INTRAVENOUS

## 2019-01-09 MED ORDER — AMIODARONE HCL 100 MG PO TABS
200.0000 mg | ORAL_TABLET | Freq: Every day | ORAL | Status: DC
  Administered 2019-01-10 – 2019-01-11 (×2): 200 mg via ORAL
  Filled 2019-01-09 (×2): qty 2

## 2019-01-09 MED ORDER — INSULIN ASPART 100 UNIT/ML ~~LOC~~ SOLN
6.0000 [IU] | Freq: Three times a day (TID) | SUBCUTANEOUS | Status: DC
  Administered 2019-01-09: 6 [IU] via SUBCUTANEOUS

## 2019-01-09 MED ORDER — DOCUSATE SODIUM 100 MG PO CAPS
100.0000 mg | ORAL_CAPSULE | Freq: Two times a day (BID) | ORAL | Status: DC
  Administered 2019-01-09: 100 mg via ORAL
  Filled 2019-01-09: qty 1

## 2019-01-09 MED ORDER — ALBUTEROL SULFATE HFA 108 (90 BASE) MCG/ACT IN AERS: 2.0000 | INHALATION_SPRAY | RESPIRATORY_TRACT | Status: AC | PRN

## 2019-01-09 MED ORDER — CARVEDILOL 12.5 MG PO TABS
25.0000 mg | ORAL_TABLET | Freq: Two times a day (BID) | ORAL | Status: DC
  Administered 2019-01-09 – 2019-01-11 (×4): 25 mg via ORAL
  Filled 2019-01-09 (×5): qty 2

## 2019-01-09 MED ORDER — ENOXAPARIN SODIUM 40 MG/0.4ML ~~LOC~~ SOLN: 40.0000 mg | SUBCUTANEOUS | Status: DC

## 2019-01-09 MED ORDER — LOSARTAN POTASSIUM 50 MG PO TABS: ORAL_TABLET | ORAL | Status: DC

## 2019-01-09 MED ORDER — INSULIN GLARGINE 100 UNIT/ML ~~LOC~~ SOLN: 40.0000 [IU] | Freq: Every day | SUBCUTANEOUS | Status: DC

## 2019-01-09 MED ORDER — SODIUM CHLORIDE 0.9 % IV SOLN
100.0000 mg | Freq: Every day | INTRAVENOUS | Status: DC
  Filled 2019-01-09: qty 20

## 2019-01-09 MED ORDER — GUAIFENESIN-DM 100-10 MG/5ML PO SYRP
10.0000 mL | ORAL_SOLUTION | ORAL | Status: DC | PRN
  Administered 2019-01-09: 10 mL via ORAL
  Filled 2019-01-09: qty 10

## 2019-01-09 MED ORDER — ROSUVASTATIN CALCIUM 20 MG PO TABS
40.0000 mg | ORAL_TABLET | Freq: Every day | ORAL | Status: DC
  Administered 2019-01-10 – 2019-01-11 (×2): 40 mg via ORAL
  Filled 2019-01-09 (×3): qty 2

## 2019-01-09 MED ORDER — POLYETHYLENE GLYCOL 3350 17 G PO PACK: 17.0000 g | PACK | Freq: Every day | ORAL | Status: DC

## 2019-01-09 MED ORDER — PANTOPRAZOLE SODIUM 40 MG PO TBEC
40.0000 mg | DELAYED_RELEASE_TABLET | Freq: Every day | ORAL | Status: DC
  Administered 2019-01-10 – 2019-01-11 (×2): 40 mg via ORAL
  Filled 2019-01-09 (×2): qty 1

## 2019-01-09 MED ORDER — PANTOPRAZOLE SODIUM 40 MG PO TBEC
40.0000 mg | DELAYED_RELEASE_TABLET | Freq: Every day | ORAL | Status: DC
  Filled 2019-01-09: qty 1

## 2019-01-09 MED ORDER — ZINC SULFATE 220 (50 ZN) MG PO CAPS: 220.0000 mg | ORAL_CAPSULE | Freq: Every day | ORAL | Status: AC

## 2019-01-09 MED ORDER — SODIUM CHLORIDE 0.9 % IV SOLN: 100.0000 mg | Freq: Every day | INTRAVENOUS | Status: DC

## 2019-01-09 MED ORDER — ASCORBIC ACID 500 MG PO TABS: 500.0000 mg | ORAL_TABLET | Freq: Every day | ORAL | Status: AC

## 2019-01-09 MED ORDER — ASPIRIN EC 81 MG PO TBEC
81.0000 mg | DELAYED_RELEASE_TABLET | Freq: Every day | ORAL | Status: DC
  Filled 2019-01-09: qty 1

## 2019-01-09 MED ORDER — METHYLPREDNISOLONE SODIUM SUCC 125 MG IJ SOLR
0.5000 mg/kg | Freq: Two times a day (BID) | INTRAMUSCULAR | Status: DC
  Filled 2019-01-09: qty 2

## 2019-01-09 MED ORDER — ACETAMINOPHEN 325 MG PO TABS: 650.0000 mg | ORAL_TABLET | Freq: Four times a day (QID) | ORAL | Status: DC | PRN

## 2019-01-09 MED ORDER — POLYETHYLENE GLYCOL 3350 17 G PO PACK: 17.0000 g | PACK | Freq: Every day | ORAL | Status: DC | PRN

## 2019-01-09 MED ORDER — INSULIN GLARGINE 100 UNIT/ML ~~LOC~~ SOLN: 42.0000 [IU] | Freq: Every day | SUBCUTANEOUS | Status: DC

## 2019-01-09 MED ORDER — INSULIN ASPART 100 UNIT/ML ~~LOC~~ SOLN
0.0000 [IU] | Freq: Three times a day (TID) | SUBCUTANEOUS | Status: DC
  Administered 2019-01-10 (×2): 20 [IU] via SUBCUTANEOUS
  Administered 2019-01-10: 4 [IU] via SUBCUTANEOUS
  Administered 2019-01-11: 11 [IU] via SUBCUTANEOUS

## 2019-01-09 MED ORDER — TORSEMIDE 20 MG PO TABS: ORAL_TABLET | ORAL | Status: AC

## 2019-01-09 MED ORDER — ONDANSETRON HCL 4 MG PO TABS: 4.0000 mg | ORAL_TABLET | Freq: Four times a day (QID) | ORAL | Status: DC | PRN

## 2019-01-09 MED ORDER — ASPIRIN EC 81 MG PO TBEC
81.0000 mg | DELAYED_RELEASE_TABLET | Freq: Every day | ORAL | Status: DC
  Administered 2019-01-10 – 2019-01-11 (×2): 81 mg via ORAL
  Filled 2019-01-09 (×2): qty 1

## 2019-01-09 MED ORDER — METHYLPREDNISOLONE SODIUM SUCC 125 MG IJ SOLR: 60.0000 mg | Freq: Two times a day (BID) | INTRAMUSCULAR | 0 refills | Status: DC

## 2019-01-09 MED ORDER — ASCORBIC ACID 500 MG PO TABS
500.0000 mg | ORAL_TABLET | Freq: Every day | ORAL | Status: DC
  Administered 2019-01-10 – 2019-01-11 (×2): 500 mg via ORAL
  Filled 2019-01-09 (×3): qty 1

## 2019-01-09 MED ORDER — INSULIN GLARGINE 100 UNIT/ML ~~LOC~~ SOLN: 30.0000 [IU] | Freq: Every day | SUBCUTANEOUS | Status: DC

## 2019-01-09 MED ORDER — IPRATROPIUM BROMIDE HFA 17 MCG/ACT IN AERS: 2.0000 | INHALATION_SPRAY | RESPIRATORY_TRACT | 12 refills | Status: AC

## 2019-01-09 MED ORDER — SODIUM CHLORIDE 0.9% FLUSH
3.0000 mL | Freq: Two times a day (BID) | INTRAVENOUS | Status: DC
  Administered 2019-01-09 – 2019-01-11 (×4): 3 mL via INTRAVENOUS

## 2019-01-09 MED ORDER — INSULIN ASPART 100 UNIT/ML ~~LOC~~ SOLN: 4.0000 [IU] | Freq: Three times a day (TID) | SUBCUTANEOUS | Status: DC

## 2019-01-09 MED ORDER — SODIUM CHLORIDE 0.9% FLUSH
3.0000 mL | Freq: Two times a day (BID) | INTRAVENOUS | Status: DC
  Administered 2019-01-09 – 2019-01-11 (×4): 3 mL via INTRAVENOUS

## 2019-01-09 NOTE — H&P (Signed)
Please note-this patient was discharged from [facility] hospitalist group earlier this morning.  Please see their H&P and discharge summary for further details.  Brief narrative: Patient is a 64 year old male with history of HTN, dyslipidemia, DM-2, chronic diastolic heart failure, CKD stage IIIa, PAF, OSA on CPAP-who had a positive Covid test on 01/06/2019 as an outpatient-he presented to [facility] on 1/5 with shortness of breath-apparently required 100% NRB mask in the emergency room to maintain O2 saturation.  Subsequently admitted to [facility] and started on steroids and remdesivir.  Continue to require high flow O2 while at [facility].  He was subsequently transferred to [facility] on 1/7 for further continued care.  Subjective: Feels great.  He was titrated down to just 2-3 L of oxygen this afternoon-1 transfer to [facility].  Objective: Exam: Blood pressure 114/85, pulse 96, temperature 97.8 F (36.6 C), temperature source Axillary, resp. rate (!) 21, height 5' 7.01" (1.702 m), weight 99.8 kg, SpO2 92 %. Chest: Bilaterally clear to auscultation CVS: S1-S2-regular Abdomen soft nontender nondistended Extremities no edema Neurology: Nonfocal exam  Labs:  Recent Labs    01/07/19 1233 01/07/19 1345 01/08/19 0841 01/09/19 0458  DDIMER  --  2.61*  --   --   FERRITIN 205  --  382* 461*  LDH 442*  --   --   --   CRP 3.7*  --   --   --     Lab Results  Component Value Date   SARSCOV2NAA POSITIVE (A) 01/08/2019    Assessment and plan: Acute hypoxemic respiratory failure secondary to COVID-19 pneumonia: Seems to be markedly better today-continue steroids/remdesivir.  Continued attempts to titrate off oxygen.  Covid 19 medications: Steroids: 1/5>> Remdesivir: 1/5>>  Other medications: Diuretics: Not needed as euvolemic Antibiotics: Not needed as no evidence of bacterial infection  CKD stage IIIb: Creatinine close to usual baseline  Transaminitis: Likely secondary to COVID-19-follow  for now.  Chronic diastolic heart failure: Euvolemic-does not require diuretics.  PAF: Claims that he is on amiodarone and Xarelto (gets sample prescriptions from PCP-otherwise not affordable)-we will go ahead and resume both of these medications.  HTN: Continue Coreg-resume losartan in the next few days if renal function permits.  DM-2 with uncontrolled hyperglycemia (A1c 11.5): Appears to have poor baseline control-suspect worsened by use of steroids.  Continue Lantus but will increase to 45 units nightly, add 6 units of NovoLog with meals-start resistant SSI scale.  OSA: Unable to use CPAP due to facility policy-we will use O2  DVT prophylaxis: Xarelto  CODE STATUS: Full code  Family Communication: Spoke with daughter over the phone

## 2019-01-09 NOTE — Progress Notes (Addendum)
Inpatient Diabetes Program Recommendations  AACE/ADA: New Consensus Statement on Inpatient Glycemic Control (2015)  Target Ranges:  Prepandial:   less than 140 mg/dL      Peak postprandial:   less than 180 mg/dL (1-2 hours)      Critically ill patients:  140 - 180 mg/dL   Lab Results  Component Value Date   GLUCAP 387 (H) 01/09/2019   HGBA1C 11.5 (H) 01/08/2019    Review of Glycemic Control  Results for [name], [name] (MRN [id number]) as of 01/09/2019 10:15  Ref. Range 01/08/2019 08:36 01/08/2019 14:16 01/08/2019 16:40 01/08/2019 20:17 01/09/2019 07:54  Glucose-Capillary Latest Ref Range: 70 - 99 mg/dL 211 (H) 941 (H) 740 (H) 465 (H) 387 (H)    Diabetes history: DM2 Outpatient Diabetes medications: Amaryl 4 mg daily; glipizide 2 mg BID; Lantus 30 units QHS Current orders for Inpatient glycemic control: Lantus 30 Daily; Novolog 0-20 TID with meals; Solumedrol 60mg  BID  Inpatient Diabetes Program Recommendations:     -Novolog 0-20 Q4H -Novolog 4 units TID with meals if eats at least 50% of meal  Agree with increasing Lantus to 30 units daily  Thank you, , RN, BSN Diabetes Coordinator Inpatient Diabetes Program [phone] (team pager from 8a-5p)

## 2019-01-09 NOTE — Progress Notes (Signed)
Patient blood sugar 414 at 2000, physician notified, requesting intervention.    21:48 New order placed byphysician for increased frequency of blood glucose monitoring accompanied by sliding scale.

## 2019-01-09 NOTE — Consult Note (Signed)
01/09/19 14:15 Pt transferred from [facility] earlier today without prior doses of Remdesivir. Initiated therapy 200mg   Iv x1 today then 100mg  IV q24h x4 days starting 01/10/19. , RPh  [facility] Staff Pharmacist

## 2019-01-09 NOTE — ED Notes (Signed)
Pt given breakfast meal tray. Given lemon-lime soda with ice per request. Call bell within reach.

## 2019-01-09 NOTE — ED Notes (Signed)
Report called to [employer] [phone]

## 2019-01-09 NOTE — ED Notes (Signed)
Answered pts call bell. Pt requesting coffee. Okay per [employer], Charity fundraiser. Per request pt given coffee with creamer and sweetener.

## 2019-01-09 NOTE — ED Notes (Signed)
Attempt number 2 to call report to [facility] unsuccessful.

## 2019-01-09 NOTE — Progress Notes (Signed)
Pt fell asleep during a conversation with this RT, easily aroused. Spoke with MD and changed CPAP order to BiPAP order. Upon administration of BiPAP pt fell asleep.

## 2019-01-09 NOTE — Progress Notes (Signed)
Report called to Eileen Stanford RN at [facility], patient awaiting transportation.

## 2019-01-09 NOTE — Progress Notes (Signed)
[facility] for [facility] to their facility at this time. I discussed with patient transfer to [facility] center and he is in agreement.  Discussed with [facility] and Dr. Dwana Curd.  He was accepted for transfer to [facility] provided he can tolerate being off bipap for transport. No bed ha yet been assigned

## 2019-01-09 NOTE — ED Notes (Signed)
Consent for transfer signed

## 2019-01-09 NOTE — ED Notes (Addendum)
Per NP pt should be swapped to [facility] or [facility]. Cori, RT notified. RN aware also.

## 2019-01-09 NOTE — ED Notes (Signed)
Labs drawn and sent to lab.

## 2019-01-09 NOTE — ED Notes (Signed)
Hospitalist texted with blood sugar results.

## 2019-01-09 NOTE — Plan of Care (Signed)
Patient arrived to 124 on 15 LPM NRB. Weaned down and now on 3LPM via [place]. Patient has had no issues. Accu check 423 and one time order given for 25units. Patient did not know what medication he was taking and seemed to be poorly educated regarding health information. Will continue to monitor. Call light in reach.  Problem: Education: Goal: Knowledge of risk factors and measures for prevention of condition will improve Outcome: Progressing   Problem: Coping: Goal: Psychosocial and spiritual needs will be supported Outcome: Progressing   Problem: Respiratory: Goal: Will maintain a patent airway Outcome: Progressing Goal: Complications related to the disease process, condition or treatment will be avoided or minimized Outcome: Progressing   Problem: Education: Goal: Knowledge of General Education information will improve Description: Including pain rating scale, medication(s)/side effects and non-pharmacologic comfort measures Outcome: Progressing   Problem: Health Behavior/Discharge Planning: Goal: Ability to manage health-related needs will improve Outcome: Progressing   Problem: Clinical Measurements: Goal: Ability to maintain clinical measurements within normal limits will improve Outcome: Progressing Goal: Will remain free from infection Outcome: Progressing Goal: Diagnostic test results will improve Outcome: Progressing Goal: Respiratory complications will improve Outcome: Progressing Goal: Cardiovascular complication will be avoided Outcome: Progressing   Problem: Activity: Goal: Risk for activity intolerance will decrease Outcome: Progressing   Problem: Nutrition: Goal: Adequate nutrition will be maintained Outcome: Progressing   Problem: Coping: Goal: Level of anxiety will decrease Outcome: Progressing   Problem: Elimination: Goal: Will not experience complications related to bowel motility Outcome: Progressing Goal: Will not experience complications related  to urinary retention Outcome: Progressing   Problem: Pain Managment: Goal: General experience of comfort will improve Outcome: Progressing   Problem: Safety: Goal: Ability to remain free from injury will improve Outcome: Progressing   Problem: Skin Integrity: Goal: Risk for impaired skin integrity will decrease Outcome: Progressing

## 2019-01-09 NOTE — Progress Notes (Signed)
Notified MD of 423 accu check. STAT glucose ordered and also ordered 25 units of Novolog.

## 2019-01-09 NOTE — ED Notes (Signed)
Attempt to call report unsuccessful. [phone]

## 2019-01-09 NOTE — Progress Notes (Signed)
BG 439, per MD give 20 units novolog, no repeat lab verification needed.

## 2019-01-09 NOTE — ED Notes (Signed)
Patient is resting comfortably. 

## 2019-01-09 NOTE — ED Notes (Signed)
Assisted pt to bathroom. Pt able to have bowel movement. Bed linens changed and bed wiped down. Call bell within reach.

## 2019-01-09 NOTE — ED Notes (Signed)
[facility] at bedside 

## 2019-01-09 NOTE — Progress Notes (Signed)
Per NP verbal, patient taken off of BIPAP and transitioned to [facility] to see if patient can tolerate being off BIPAP for transport to [facility]. Patient tolerated transition well. Resting comfortably in bed on NRBM, SAT 95%. Bipap and Heated High Flow Nasal Cannula on standby in room.

## 2019-01-10 ENCOUNTER — Encounter (HOSPITAL_COMMUNITY): Payer: Self-pay | Admitting: Internal Medicine

## 2019-01-10 DIAGNOSIS — U071 COVID-19: Principal | ICD-10-CM

## 2019-01-10 DIAGNOSIS — I5032 Chronic diastolic (congestive) heart failure: Secondary | ICD-10-CM

## 2019-01-10 DIAGNOSIS — J1282 Pneumonia due to coronavirus disease 2019: Secondary | ICD-10-CM

## 2019-01-10 DIAGNOSIS — I10 Essential (primary) hypertension: Secondary | ICD-10-CM

## 2019-01-10 LAB — TYPE AND SCREEN
ABO/RH(D): O POS
Antibody Screen: NEGATIVE

## 2019-01-10 LAB — COMPREHENSIVE METABOLIC PANEL
ALT: 123 U/L — ABNORMAL HIGH (ref 0–44)
AST: 74 U/L — ABNORMAL HIGH (ref 15–41)
Albumin: 3.6 g/dL (ref 3.5–5.0)
Alkaline Phosphatase: 143 U/L — ABNORMAL HIGH (ref 38–126)
Anion gap: 12 (ref 5–15)
BUN: 35 mg/dL — ABNORMAL HIGH (ref 8–23)
CO2: 30 mmol/L (ref 22–32)
Calcium: 8.6 mg/dL — ABNORMAL LOW (ref 8.9–10.3)
Chloride: 96 mmol/L — ABNORMAL LOW (ref 98–111)
Creatinine, Ser: 1.69 mg/dL — ABNORMAL HIGH (ref 0.61–1.24)
GFR calc Af Amer: 49 mL/min — ABNORMAL LOW (ref >60)
GFR calc non Af Amer: 42 mL/min — ABNORMAL LOW (ref >60)
Glucose, Bld: 339 mg/dL — ABNORMAL HIGH (ref 70–99)
Potassium: 3.9 mmol/L (ref 3.5–5.1)
Sodium: 138 mmol/L (ref 135–145)
Total Bilirubin: 0.8 mg/dL (ref 0.3–1.2)
Total Protein: 6.8 g/dL (ref 6.5–8.1)

## 2019-01-10 LAB — GLUCOSE, CAPILLARY
Glucose-Capillary: 414 mg/dL — ABNORMAL HIGH (ref 70–99)
Glucose-Capillary: 188 mg/dL — ABNORMAL HIGH (ref 70–99)
Glucose-Capillary: 369 mg/dL — ABNORMAL HIGH (ref 70–99)
Glucose-Capillary: 353 mg/dL — ABNORMAL HIGH (ref 70–99)
Glucose-Capillary: 310 mg/dL — ABNORMAL HIGH (ref 70–99)

## 2019-01-10 LAB — ABO/RH: ABO/RH(D): O POS

## 2019-01-10 LAB — C-REACTIVE PROTEIN: CRP: 2.2 mg/dL — ABNORMAL HIGH (ref <1.0)

## 2019-01-10 LAB — CBC WITH DIFFERENTIAL/PLATELET
Abs Immature Granulocytes: 0.05 10*3/uL (ref 0.00–0.07)
Basophils Absolute: 0 10*3/uL (ref 0.0–0.1)
Basophils Relative: 0 %
Eosinophils Absolute: 0 10*3/uL (ref 0.0–0.5)
Eosinophils Relative: 0 %
HCT: 41.8 % (ref 39.0–52.0)
Hemoglobin: 13.2 g/dL (ref 13.0–17.0)
Immature Granulocytes: 1 %
Lymphocytes Relative: 4 %
Lymphs Abs: 0.4 10*3/uL — ABNORMAL LOW (ref 0.7–4.0)
MCH: 27.7 pg (ref 26.0–34.0)
MCHC: 31.6 g/dL (ref 30.0–36.0)
MCV: 87.8 fL (ref 80.0–100.0)
Monocytes Absolute: 0.4 10*3/uL (ref 0.1–1.0)
Monocytes Relative: 4 %
Neutro Abs: 9.1 10*3/uL — ABNORMAL HIGH (ref 1.7–7.7)
Neutrophils Relative %: 91 %
Platelets: 151 10*3/uL (ref 150–400)
RBC: 4.76 MIL/uL (ref 4.22–5.81)
RDW: 14.3 % (ref 11.5–15.5)
WBC: 9.9 10*3/uL (ref 4.0–10.5)
nRBC: 0 % (ref 0.0–0.2)

## 2019-01-10 LAB — FERRITIN: Ferritin: 440 ng/mL — ABNORMAL HIGH (ref 24–336)

## 2019-01-10 LAB — D-DIMER, QUANTITATIVE: D-Dimer, Quant: 2.33 ug/mL-FEU — ABNORMAL HIGH (ref 0.00–0.50)

## 2019-01-10 MED ORDER — INSULIN GLARGINE 100 UNIT/ML ~~LOC~~ SOLN
50.0000 [IU] | Freq: Every day | SUBCUTANEOUS | Status: DC
  Administered 2019-01-10: 50 [IU] via SUBCUTANEOUS
  Filled 2019-01-10 (×2): qty 0.5

## 2019-01-10 MED ORDER — INSULIN GLARGINE 100 UNIT/ML ~~LOC~~ SOLN
20.0000 [IU] | Freq: Every morning | SUBCUTANEOUS | Status: DC
  Administered 2019-01-10 – 2019-01-11 (×2): 20 [IU] via SUBCUTANEOUS
  Filled 2019-01-10 (×2): qty 0.2

## 2019-01-10 MED ORDER — INSULIN GLARGINE 100 UNIT/ML ~~LOC~~ SOLN: 50.0000 [IU] | Freq: Every day | SUBCUTANEOUS | Status: DC

## 2019-01-10 MED ORDER — INSULIN ASPART 100 UNIT/ML ~~LOC~~ SOLN
12.0000 [IU] | Freq: Three times a day (TID) | SUBCUTANEOUS | Status: DC
  Administered 2019-01-10 – 2019-01-11 (×4): 12 [IU] via SUBCUTANEOUS

## 2019-01-10 NOTE — Progress Notes (Signed)
Pt ambulated today and desated to 88% on room air. Pt recovered to baseline upon placing pt back on 2l/[place]. Pt currently resting in room eyes open breathing normal with no s/s of distress noted. Will continue to monitor.

## 2019-01-10 NOTE — Progress Notes (Signed)
PROGRESS NOTE                                                                                                                                                                                                             Patient Demographics:    [name], is a 64 y.o. male, DOB - [birth year], GYI:[id number]  Outpatient Primary MD for the patient is [name], MD   Admit date - 01/09/2019   LOS - 1  No chief complaint on file.      Brief Narrative: Patient is a 64 year old male with history of HTN, dyslipidemia, DM-2, chronic diastolic heart failure, CKD stage IIIa, PAF, OSA on CPAP-who had a positive Covid test on 01/06/2019 as an outpatient-he presented to [facility] on 1/5 with shortness of breath-apparently required 100% NRB mask in the emergency room to maintain O2 saturation.  Subsequently admitted to [facility] and started on steroids and remdesivir.  Continue to require high flow O2 while at [facility].  He was subsequently transferred to [facility] on 1/7 for further continued care.   Subjective:    [name] today continues to improve-he was eating breakfast this morning-adjust on 1-2 L of oxygen.   Assessment  & Plan :   Acute hypoxemic respiratory failure secondary to COVID-19 pneumonia: Continues to improve-down to just 1-2 L of oxygen.  Continue steroids/remdesivir-CRP is downtrending.  Continue at attempts to titrate off oxygen.    Needs to be assessed for home O2 requirement prior to discharge.    Fever: afebrile  O2 requirements:  SpO2: 91 % O2 Flow Rate (L/min): 2 L/min   COVID-19 Labs: Recent Labs    01/07/19 1345 01/08/19 0841 01/09/19 0458 01/09/19 1641 01/10/19 0031  DDIMER 2.61*  --   --  2.41* 2.33*  FERRITIN  --  382* 461*  --  440*  CRP  --   --   --  2.8* 2.2*       Component Value Date/Time   BNP 41.0 01/09/2019 1641    Recent Labs  Lab 01/07/19 1233 01/09/19 1641    PROCALCITON 0.91 1.31    Lab Results  Component Value Date   SARSCOV2NAA POSITIVE (A) 01/08/2019    COVID-19 Medications: Steroids: 1/5>> Remdesivir: 1/5>>  Other medications: Diuretics: Not needed as euvolemic Antibiotics: Not needed as no evidence of bacterial  infection  Prone/Incentive Spirometry: encouraged ncentive spirometry use 3-4/hour.  DVT Prophylaxis  :  xarelto  CKD stage IIIb: Creatinine close to usual baseline  Transaminitis: Likely secondary to COVID-19-follow for now.  Chronic diastolic heart failure: Euvolemic-does not require diuretics.  PAF: Remains in sinus rhythm-rate controlled with amiodarone-continue Xarelto (claims that he is on Xarelto as outpatient-and gets samples from his PCPs office as it is unaffordable)   HTN:  Continue Coreg-resume losartan the next few days if renal function permits.  DM-2 with uncontrolled hyperglycemia (A1c 11.5): Appears to have poor baseline control-suspect worsened by use of steroids.    CBGs still remain elevated-increase p.m. Lantus to 50 units, add 20 units of NovoLog with meals, increase premeal NovoLog to site.  Follow and optimize.    CBG (last 3)  Recent Labs    01/09/19 1553 01/10/19 0808 01/10/19 1155  GLUCAP 423* 188* 369*    OSA: Unable to use CPAP due to facility policy-we will use O2  Obesity: Estimated body mass index is 34.45 kg/m as calculated from the following:   Height as of this encounter: 5' 7.01" (1.702 m).   Weight as of this encounter: 99.8 kg.    Consults  :  None  Procedures  :  None  ABG:    Component Value Date/Time   HCO3 38.2 (H) 01/07/2019 1235   O2SAT 83.0 01/07/2019 1235    Vent Settings: N/A  Condition - Stable  Family Communication  :  Daughter updated over the phone 1/8  Code Status :  Full Code  Diet :  Diet Order            Diet heart healthy/carb modified Room service appropriate? Yes; Fluid consistency: Thin  Diet effective now                Disposition Plan  :  Remain hospitalized-suspect home in the next day or so if clinical improvement continues.  Barriers to discharge: Hypoxia requiring O2 supplementation/complete 5 days of IV Remdesivir  Antimicorbials  :    Anti-infectives (From admission, onward)   Start     Dose/Rate Route Frequency Ordered Stop   01/10/19 1000  remdesivir 100 mg in sodium chloride 0.9 % 100 mL IVPB  Status:  Discontinued     100 mg 200 mL/hr over 30 Minutes Intravenous Daily 01/09/19 1407 01/09/19 1610   01/10/19 1000  remdesivir 100 mg in sodium chloride 0.9 % 100 mL IVPB  Status:  Discontinued     100 mg 200 mL/hr over 30 Minutes Intravenous Daily 01/09/19 1610 01/09/19 1613   01/10/19 1000  remdesivir 100 mg in sodium chloride 0.9 % 100 mL IVPB     100 mg 200 mL/hr over 30 Minutes Intravenous Daily 01/09/19 1617 01/12/19 0959   01/09/19 1500  remdesivir 200 mg in sodium chloride 0.9% 250 mL IVPB  Status:  Discontinued     200 mg 580 mL/hr over 30 Minutes Intravenous Once 01/09/19 1407 01/09/19 1613      Inpatient Medications  Scheduled Meds: . amiodarone  200 mg Oral Daily  . vitamin C  500 mg Oral Daily  . aspirin EC  81 mg Oral Daily  . carvedilol  25 mg Oral BID  . insulin aspart  0-20 Units Subcutaneous TID WC  . insulin aspart  0-5 Units Subcutaneous QHS  . insulin aspart  12 Units Subcutaneous TID WC  . insulin glargine  20 Units Subcutaneous q morning - 10a  . insulin glargine  50 Units Subcutaneous QHS  . methylPREDNISolone (SOLU-MEDROL) injection  40 mg Intravenous Q12H  . pantoprazole  40 mg Oral Daily  . rivaroxaban  20 mg Oral Q supper  . rosuvastatin  40 mg Oral Daily  . sodium chloride flush  3 mL Intravenous Q12H  . sodium chloride flush  3 mL Intravenous Q12H  . zinc sulfate  220 mg Oral Daily   Continuous Infusions: . sodium chloride    . remdesivir 100 mg in NS 100 mL 100 mg (01/10/19 1100)   PRN Meds:.sodium chloride, acetaminophen, albuterol,  chlorpheniramine-HYDROcodone, guaiFENesin-dextromethorphan, ondansetron **OR** ondansetron (ZOFRAN) IV, polyethylene glycol, sodium chloride flush   Time Spent in minutes  25  See all Orders from today for further details   Jeoffrey MassedShanker Liliahna Cudd M.D on 01/10/2019 at 1:44 PM  To page go to [email or web address] - use universal password  [facility] -  Office  [phone]    Objective:   Vitals:   01/09/19 2000 01/10/19 0000 01/10/19 0400 01/10/19 0805  BP: 111/85 117/83 (!) 132/102 (!) 131/100  Pulse: 97 100 100 (!) 102  Resp: 17 17 16 18   Temp: 98.2 F (36.8 C) 98.2 F (36.8 C) 98.4 F (36.9 C) 98.3 F (36.8 C)  TempSrc: Oral Oral Oral Oral  SpO2: 96% 97% 98% 91%  Weight:      Height:        Wt Readings from Last 3 Encounters:  01/09/19 99.8 kg  01/07/19 99.8 kg  03/08/15 112.1 kg     Intake/Output Summary (Last 24 hours) at 01/10/2019 1344 Last data filed at 01/10/2019 0100 Gross per 24 hour  Intake 120 ml  Output 500 ml  Net -380 ml     Physical Exam Gen Exam:Alert awake-not in any distress HEENT:atraumatic, normocephalic Chest: B/L clear to auscultation anteriorly CVS:S1S2 regular Abdomen:soft non tender, non distended Extremities:no edema Neurology: Non focal Skin: no rash   Data Review:    CBC Recent Labs  Lab 01/07/19 1233 01/08/19 0841 01/09/19 0458 01/10/19 0031  WBC 7.2 5.0 6.5 9.9  HGB 13.1 12.6* 13.0 13.2  HCT 40.4 39.2 41.2 41.8  PLT 144* 138* 140* 151  MCV 83.8 85.6 87.1 87.8  MCH 27.2 27.5 27.5 27.7  MCHC 32.4 32.1 31.6 31.6  RDW 14.0 14.1 14.1 14.3  LYMPHSABS 0.9 0.6* 0.4* 0.4*  MONOABS 0.9 0.2 0.3 0.4  EOSABS 0.0 0.0 0.0 0.0  BASOSABS 0.0 0.0 0.0 0.0    Chemistries  Recent Labs  Lab 01/07/19 1232 01/08/19 0841 01/09/19 0458 01/09/19 1641 01/10/19 0031  NA 136 136 139  --  138  K 3.3* 3.7 4.1  --  3.9  CL 92* 95* 96*  --  96*  CO2 28 31 33*  --  30  GLUCOSE 361* 337* 423* 438* 339*  BUN 22 26* 30*  --  35*  CREATININE  1.93* 1.74* 1.73*  --  1.69*  CALCIUM 7.9* 8.0* 8.3*  --  8.6*  MG  --  1.9 2.1  --   --   AST 71* 166* 118*  --  74*  ALT 33 127* 140*  --  123*  ALKPHOS 171* 165* 161*  --  143*  BILITOT 0.9 0.7 0.7  --  0.8   ------------------------------------------------------------------------------------------------------------------ No results for input(s): CHOL, HDL, LDLCALC, TRIG, CHOLHDL, LDLDIRECT in the last 72 hours.  Lab Results  Component Value Date   HGBA1C 11.5 (H) 01/08/2019   ------------------------------------------------------------------------------------------------------------------ No results for input(s): TSH, T4TOTAL, T3FREE, THYROIDAB in the  last 72 hours.  Invalid input(s): FREET3 ------------------------------------------------------------------------------------------------------------------ Recent Labs    01/09/19 0458 01/10/19 0031  FERRITIN 461* 440*    Coagulation profile No results for input(s): INR, PROTIME in the last 168 hours.  Recent Labs    01/09/19 1641 01/10/19 0031  DDIMER 2.41* 2.33*    Cardiac Enzymes No results for input(s): CKMB, TROPONINI, MYOGLOBIN in the last 168 hours.  Invalid input(s): CK ------------------------------------------------------------------------------------------------------------------    Component Value Date/Time   BNP 41.0 01/09/2019 1641    Micro Results Recent Results (from the past 240 hour(s))  Culture, blood (Routine X 2) w Reflex to ID Panel     Status: None (Preliminary result)   Collection Time: 01/07/19  8:02 PM   Specimen: BLOOD  Result Value Ref Range Status   Specimen Description BLOOD BLOOD LEFT HAND  Final   Special Requests   Final    BOTTLES DRAWN AEROBIC AND ANAEROBIC Blood Culture adequate volume   Culture   Final    NO GROWTH 3 DAYS Performed at [facility], [address], [place], [place] [address]    Report Status PENDING  Incomplete  Culture, blood (Routine X 2) w  Reflex to ID Panel     Status: None (Preliminary result)   Collection Time: 01/07/19  8:02 PM   Specimen: BLOOD  Result Value Ref Range Status   Specimen Description BLOOD LEFT ARM  Final   Special Requests   Final    BOTTLES DRAWN AEROBIC ONLY Blood Culture results may not be optimal due to an inadequate volume of blood received in culture bottles   Culture   Final    NO GROWTH 3 DAYS Performed at [facility], [address] [place], [place] [address]    Report Status PENDING  Incomplete  SARS CORONAVIRUS 2 (TAT 6-24 HRS) Nasopharyngeal Nasopharyngeal Swab     Status: Abnormal   Collection Time: 01/08/19 10:21 PM   Specimen: Nasopharyngeal Swab  Result Value Ref Range Status   SARS Coronavirus 2 POSITIVE (A) NEGATIVE Final    Comment: RESULT CALLED TO, READ BACK BY AND VERIFIED WITH: Wyvonnia Dusky RN 10:25 01/09/19 ([email or web address]) (NOTE) SARS-CoV-2 target nucleic acids are DETECTED. The SARS-CoV-2 RNA is generally detectable in upper and lower respiratory specimens during the acute phase of infection. Positive results are indicative of the presence of SARS-CoV-2 RNA. Clinical correlation with patient history and other diagnostic information is  necessary to determine patient infection status. Positive results do not rule out bacterial infection or co-infection with other viruses.  The expected result is Negative. Fact Sheet for Patients: HairSlick.no Fact Sheet for Healthcare Providers: quierodirigir.com This test is not yet approved or cleared by the [place] FDA and  has been authorized for detection and/or diagnosis of SARS-CoV-2 by FDA under an Emergency Use Authorization (EUA). This EUA will remain  in effect (meaning this test can be used) for the d uration of the COVID-19 declaration under Section 564(b)(1) of the Act, 21 U.S.C. section 360bbb-3(b)(1), unless the authorization is terminated or revoked  sooner. Performed at [facility], [address] [place], [place] [address]     Radiology Reports DG Chest [place] 1 View  Result Date: 01/07/2019 CLINICAL DATA:  Shortness of breath, COVID-19 EXAM: PORTABLE CHEST 1 VIEW COMPARISON:  Portable exam 1222 hours compared to 03/05/2015 FINDINGS: Enlargement of cardiac silhouette post MVR. Prominent mediastinum accentuated by portable technique. Patchy BILATERAL pulmonary infiltrates consistent with multifocal pneumonia and history of COVID-19. No pleural  effusion or pneumothorax. Bones unremarkable. IMPRESSION: Enlargement of cardiac silhouette post MVR. Patchy BILATERAL pulmonary infiltrates consistent with multifocal pneumonia and history of COVID-19. Electronically Signed   By: Ulyses Southward M.D.   On: 01/07/2019 12:52

## 2019-01-10 NOTE — Plan of Care (Signed)
  Problem: Coping: Goal: Psychosocial and spiritual needs will be supported Outcome: Progressing   Problem: Education: Goal: Knowledge of risk factors and measures for prevention of condition will improve Outcome: Progressing   Problem: Respiratory: Goal: Will maintain a patent airway Outcome: Progressing Goal: Complications related to the disease process, condition or treatment will be avoided or minimized Outcome: Progressing   Problem: Education: Goal: Knowledge of General Education information will improve Description: Including pain rating scale, medication(s)/side effects and non-pharmacologic comfort measures Outcome: Progressing

## 2019-01-11 DIAGNOSIS — Z794 Long term (current) use of insulin: Secondary | ICD-10-CM

## 2019-01-11 DIAGNOSIS — E1121 Type 2 diabetes mellitus with diabetic nephropathy: Secondary | ICD-10-CM

## 2019-01-11 DIAGNOSIS — N1832 Chronic kidney disease, stage 3b: Secondary | ICD-10-CM

## 2019-01-11 LAB — D-DIMER, QUANTITATIVE: D-Dimer, Quant: 2.24 ug/mL-FEU — ABNORMAL HIGH (ref 0.00–0.50)

## 2019-01-11 LAB — GLUCOSE, CAPILLARY
Glucose-Capillary: 81 mg/dL (ref 70–99)
Glucose-Capillary: 300 mg/dL — ABNORMAL HIGH (ref 70–99)

## 2019-01-11 LAB — CBC WITH DIFFERENTIAL/PLATELET
Abs Immature Granulocytes: 0.07 10*3/uL (ref 0.00–0.07)
Basophils Absolute: 0 10*3/uL (ref 0.0–0.1)
Basophils Relative: 0 %
Eosinophils Absolute: 0 10*3/uL (ref 0.0–0.5)
Eosinophils Relative: 0 %
HCT: 42.9 % (ref 39.0–52.0)
Hemoglobin: 13.6 g/dL (ref 13.0–17.0)
Immature Granulocytes: 1 %
Lymphocytes Relative: 7 %
Lymphs Abs: 0.7 10*3/uL (ref 0.7–4.0)
MCH: 27 pg (ref 26.0–34.0)
MCHC: 31.7 g/dL (ref 30.0–36.0)
MCV: 85.1 fL (ref 80.0–100.0)
Monocytes Absolute: 0.7 10*3/uL (ref 0.1–1.0)
Monocytes Relative: 7 %
Neutro Abs: 8.1 10*3/uL — ABNORMAL HIGH (ref 1.7–7.7)
Neutrophils Relative %: 85 %
Platelets: 153 10*3/uL (ref 150–400)
RBC: 5.04 MIL/uL (ref 4.22–5.81)
RDW: 14.1 % (ref 11.5–15.5)
WBC: 9.5 10*3/uL (ref 4.0–10.5)
nRBC: 0.2 % (ref 0.0–0.2)

## 2019-01-11 LAB — COMPREHENSIVE METABOLIC PANEL
ALT: 90 U/L — ABNORMAL HIGH (ref 0–44)
AST: 45 U/L — ABNORMAL HIGH (ref 15–41)
Albumin: 3.8 g/dL (ref 3.5–5.0)
Alkaline Phosphatase: 134 U/L — ABNORMAL HIGH (ref 38–126)
Anion gap: 11 (ref 5–15)
BUN: 28 mg/dL — ABNORMAL HIGH (ref 8–23)
CO2: 32 mmol/L (ref 22–32)
Calcium: 8.7 mg/dL — ABNORMAL LOW (ref 8.9–10.3)
Chloride: 99 mmol/L (ref 98–111)
Creatinine, Ser: 1.48 mg/dL — ABNORMAL HIGH (ref 0.61–1.24)
GFR calc Af Amer: 58 mL/min — ABNORMAL LOW (ref >60)
GFR calc non Af Amer: 50 mL/min — ABNORMAL LOW (ref >60)
Glucose, Bld: 195 mg/dL — ABNORMAL HIGH (ref 70–99)
Potassium: 3.5 mmol/L (ref 3.5–5.1)
Sodium: 142 mmol/L (ref 135–145)
Total Bilirubin: 1 mg/dL (ref 0.3–1.2)
Total Protein: 6.8 g/dL (ref 6.5–8.1)

## 2019-01-11 LAB — C-REACTIVE PROTEIN: CRP: 1.3 mg/dL — ABNORMAL HIGH (ref <1.0)

## 2019-01-11 LAB — FERRITIN: Ferritin: 364 ng/mL — ABNORMAL HIGH (ref 24–336)

## 2019-01-11 MED ORDER — NOVOLOG FLEXPEN 100 UNIT/ML ~~LOC~~ SOPN: PEN_INJECTOR | SUBCUTANEOUS | 0 refills | Status: AC

## 2019-01-11 MED ORDER — LANTUS SOLOSTAR 100 UNIT/ML ~~LOC~~ SOPN: 40.0000 [IU] | PEN_INJECTOR | Freq: Every day | SUBCUTANEOUS | 11 refills | Status: AC

## 2019-01-11 MED ORDER — AMLODIPINE BESYLATE 5 MG PO TABS: 5.0000 mg | ORAL_TABLET | Freq: Every day | ORAL | 0 refills | Status: AC

## 2019-01-11 MED ORDER — CARVEDILOL 25 MG PO TABS: 25.0000 mg | ORAL_TABLET | Freq: Two times a day (BID) | ORAL | Status: AC

## 2019-01-11 MED ORDER — PREDNISONE 10 MG PO TABS: ORAL_TABLET | ORAL | 0 refills | Status: AC

## 2019-01-11 NOTE — Progress Notes (Signed)
Pt teaching, education, and discharge complete. Pt states that he has home O2 at home and that he has used it prior for oxygen supplementation. Health services document signed by pt. Pt discharging on room air, no s/s of distress noted. No other concerns at this time.

## 2019-01-11 NOTE — Plan of Care (Signed)
  Problem: Coping: Goal: Psychosocial and spiritual needs will be supported Outcome: Adequate for Discharge   Problem: Education: Goal: Knowledge of risk factors and measures for prevention of condition will improve Outcome: Adequate for Discharge   Problem: Respiratory: Goal: Will maintain a patent airway Outcome: Adequate for Discharge Goal: Complications related to the disease process, condition or treatment will be avoided or minimized Outcome: Adequate for Discharge   Problem: Education: Goal: Knowledge of General Education information will improve Description: Including pain rating scale, medication(s)/side effects and non-pharmacologic comfort measures Outcome: Adequate for Discharge

## 2019-01-11 NOTE — Discharge Summary (Signed)
PATIENT DETAILS Name: [name] Age: 64 y.o. Sex: male Date of Birth: [birth year] MRN: [id number]. Admitting Physician: Maretta Bees, MD PRF:[id number], Marcelino Freestone, MD  Admit Date: 01/09/2019 Discharge date: 01/11/2019  Recommendations for Outpatient Follow-up:  1. Follow up with PCP in 1-2 weeks 2. Please obtain CMP/CBC in one week 3. Repeat Chest Xray in 4-6 week 4. Please optimize diabetic regimen.  Admitted From:  Home  Disposition: Home   Home Health: No  Equipment/Devices:  oxygen 2 L (per nursing staff patient already has this at home)  Discharge Condition: Stable  CODE STATUS: FULL CODE  Diet recommendation:  Diet Order            Diet - low sodium heart healthy        Diet Carb Modified        Diet heart healthy/carb modified Room service appropriate? Yes; Fluid consistency: Thin  Diet effective now               Brief Summary: Patient is a 64 year old male with history of HTN, dyslipidemia, DM-2, chronic diastolic heart failure, CKD stage IIIa, PAF, OSA on CPAP-who had a positive Covid test on 01/06/2019 as an outpatient-he presented to [facility] on 1/5 with shortness of breath-apparently required 100% NRB mask in the emergency room to maintain O2 saturation. Subsequently admitted to [facility] and started on steroids and remdesivir. Continue to require high flow O2 while at [facility]. He was subsequently transferred to [facility] on 1/7 for further continued care.  Brief Hospital Course:  Acute hypoxemic respiratory failure secondary to COVID-19 pneumonia:  Remarkable improvement after initiation of steroids and remdesivir.  On room air at rest-requires just 2 L of oxygen with ambulation.  Will complete last dose of remdesivir on 1/9-subsequently will be discharged on tapering steroids.   COVID-19 Labs:  Recent Labs    01/09/19 0458 01/09/19 1641 01/10/19 0031 01/11/19 0318  DDIMER  --  2.41* 2.33* 2.24*  FERRITIN 461*  --  440* 364*  CRP   --  2.8* 2.2* 1.3*    Lab Results  Component Value Date   SARSCOV2NAA POSITIVE (A) 01/08/2019     COVID-19 Medications: Steroids: 1/5>> Remdesivir: 1/5>>1/9   CKD stage IIIb:Creatinine close to usual baseline  Transaminitis: Likely secondary to COVID-19-follow for now.  Chronic diastolic heart failure: Euvolemic-does not require diuretics.  PAF: Remains in sinus rhythm-rate controlled with amiodarone-continue Xarelto (claims that he is on Xarelto as outpatient-and gets samples from his PCPs office as it is unaffordable)   HTN: Continue Coreg-add amlodipine-continue to hold losartan until seen by PCP.  DM-2 with uncontrolled hyperglycemia (A1c 11.5):Appears to have poor baseline control-suspect worsened by use of steroids.   CBGs still remain elevated-managed with escalating doses of Lantus and NovoLog during this hospital stay.  Since steroids of been tapered down significantly, will continue patient on Lantus (dose increased to 40 units nightly) along with sliding scale insulin  Obesity: Estimated body mass index is 34.45 kg/m as calculated from the following:   Height as of this encounter: 5' 7.01" (1.702 m).   Weight as of this encounter: 99.8 kg.   LDJ:TTSVXB CPAP  Procedures/Studies: None  Discharge Diagnoses:  Principal Problem:   Acute respiratory failure with hypoxemia (HCC) Active Problems:   Chronic diastolic CHF (congestive heart failure) (HCC)   Hyperlipidemia   HTN (hypertension)   Acute renal failure superimposed on stage 3a chronic kidney disease (HCC)   Pneumonia due to COVID-19 virus  Discharge Instructions:    Person Under Monitoring Name: SAAFIR ABDULLAH  Location: [address] [place] [address]   Infection Prevention Recommendations for Individuals Confirmed to have, or Being Evaluated for, 2019 Novel Coronavirus (COVID-19) Infection Who Receive Care at Home  Individuals who are confirmed to have, or are being evaluated for,  COVID-19 should follow the prevention steps below until a healthcare provider or local or state health department says they can return to normal activities.  Stay home except to get medical care You should restrict activities outside your home, except for getting medical care. Do not go to work, school, or public areas, and do not use public transportation or taxis.  Call ahead before visiting your doctor Before your medical appointment, call the healthcare provider and tell them that you have, or are being evaluated for, COVID-19 infection. This will help the healthcare provider's office take steps to keep other people from getting infected. Ask your healthcare provider to call the local or state health department.  Monitor your symptoms Seek prompt medical attention if your illness is worsening (e.g., difficulty breathing). Before going to your medical appointment, call the healthcare provider and tell them that you have, or are being evaluated for, COVID-19 infection. Ask your healthcare provider to call the local or state health department.  Wear a facemask You should wear a facemask that covers your nose and mouth when you are in the same room with other people and when you visit a healthcare provider. People who live with or visit you should also wear a facemask while they are in the same room with you.  Separate yourself from other people in your home As much as possible, you should stay in a different room from other people in your home. Also, you should use a separate bathroom, if available.  Avoid sharing household items You should not share dishes, drinking glasses, cups, eating utensils, towels, bedding, or other items with other people in your home. After using these items, you should wash them thoroughly with soap and water.  Cover your coughs and sneezes Cover your mouth and nose with a tissue when you cough or sneeze, or you can cough or sneeze into your sleeve.  Throw used tissues in a lined trash can, and immediately wash your hands with soap and water for at least 20 seconds or use an alcohol-based hand rub.  Wash your [employer] your hands often and thoroughly with soap and water for at least 20 seconds. You can use an alcohol-based hand sanitizer if soap and water are not available and if your hands are not visibly dirty. Avoid touching your eyes, nose, and mouth with unwashed hands.   Prevention Steps for Caregivers and Household Members of Individuals Confirmed to have, or Being Evaluated for, COVID-19 Infection Being Cared for in the Home  If you live with, or provide care at home for, a person confirmed to have, or being evaluated for, COVID-19 infection please follow these guidelines to prevent infection:  Follow healthcare provider's instructions Make sure that you understand and can help the patient follow any healthcare provider instructions for all care.  Provide for the patient's basic needs You should help the patient with basic needs in the home and provide support for getting groceries, prescriptions, and other personal needs.  Monitor the patient's symptoms If they are getting sicker, call his or her medical provider and tell them that the patient has, or is being evaluated for, COVID-19 infection. This will help  the healthcare provider's office take steps to keep other people from getting infected. Ask the healthcare provider to call the local or state health department.  Limit the number of people who have contact with the patient  If possible, have only one caregiver for the patient.  Other household members should stay in another home or place of residence. If this is not possible, they should stay  in another room, or be separated from the patient as much as possible. Use a separate bathroom, if available.  Restrict visitors who do not have an essential need to be in the home.  Keep older adults, very young  children, and other sick people away from the patient Keep older adults, very young children, and those who have compromised immune systems or chronic health conditions away from the patient. This includes people with chronic heart, lung, or kidney conditions, diabetes, and cancer.  Ensure good ventilation Make sure that shared spaces in the home have good air flow, such as from an air conditioner or an opened window, weather permitting.  Wash your hands often  Wash your hands often and thoroughly with soap and water for at least 20 seconds. You can use an alcohol based hand sanitizer if soap and water are not available and if your hands are not visibly dirty.  Avoid touching your eyes, nose, and mouth with unwashed hands.  Use disposable paper towels to dry your hands. If not available, use dedicated cloth towels and replace them when they become wet.  Wear a facemask and gloves  Wear a disposable facemask at all times in the room and gloves when you touch or have contact with the patient's blood, body fluids, and/or secretions or excretions, such as sweat, saliva, sputum, nasal mucus, vomit, urine, or feces.  Ensure the mask fits over your nose and mouth tightly, and do not touch it during use.  Throw out disposable facemasks and gloves after using them. Do not reuse.  Wash your hands immediately after removing your facemask and gloves.  If your personal clothing becomes contaminated, carefully remove clothing and launder. Wash your hands after handling contaminated clothing.  Place all used disposable facemasks, gloves, and other waste in a lined container before disposing them with other household waste.  Remove gloves and wash your hands immediately after handling these items.  Do not share dishes, glasses, or other household items with the patient  Avoid sharing household items. You should not share dishes, drinking glasses, cups, eating utensils, towels, bedding, or other items  with a patient who is confirmed to have, or being evaluated for, COVID-19 infection.  After the person uses these items, you should wash them thoroughly with soap and water.  Wash laundry thoroughly  Immediately remove and wash clothes or bedding that have blood, body fluids, and/or secretions or excretions, such as sweat, saliva, sputum, nasal mucus, vomit, urine, or feces, on them.  Wear gloves when handling laundry from the patient.  Read and follow directions on labels of laundry or clothing items and detergent. In general, wash and dry with the warmest temperatures recommended on the label.  Clean all areas the individual has used often  Clean all touchable surfaces, such as counters, tabletops, doorknobs, bathroom fixtures, toilets, phones, keyboards, tablets, and bedside tables, every day. Also, clean any surfaces that may have blood, body fluids, and/or secretions or excretions on them.  Wear gloves when cleaning surfaces the patient has come in contact with.  Use a diluted bleach solution (e.g., dilute  bleach with 1 part bleach and 10 parts water) or a household disinfectant with a label that says EPA-registered for coronaviruses. To make a bleach solution at home, add 1 tablespoon of bleach to 1 quart (4 cups) of water. For a larger supply, add  cup of bleach to 1 gallon (16 cups) of water.  Read labels of cleaning products and follow recommendations provided on product labels. Labels contain instructions for safe and effective use of the cleaning product including precautions you should take when applying the product, such as wearing gloves or eye protection and making sure you have good ventilation during use of the product.  Remove gloves and wash hands immediately after cleaning.  Monitor yourself for signs and symptoms of illness Caregivers and household members are considered close contacts, should monitor their health, and will be asked to limit movement outside of the  home to the extent possible. Follow the monitoring steps for close contacts listed on the symptom monitoring form.   ? If you have additional questions, contact your local health department or call the epidemiologist on call at [phone] (available 24/7). ? This guidance is subject to change. For the most up-to-date guidance from CDC, please refer to their website: YouBlogs.pl    Activity:  As tolerated   Discharge Instructions    Call MD for:  difficulty breathing, headache or visual disturbances   Complete by: As directed    Call MD for:  extreme fatigue   Complete by: As directed    Call MD for:  persistant dizziness or light-headedness   Complete by: As directed    Call MD for:  persistant nausea and vomiting   Complete by: As directed    Diet - low sodium heart healthy   Complete by: As directed    Diet Carb Modified   Complete by: As directed    Discharge instructions   Complete by: As directed    Follow with Primary MD  [name], MD in 1-2 weeks  Please get a complete blood count and chemistry panel checked by your Primary MD at your next visit, and again as instructed by your Primary MD.  Get Medicines reviewed and adjusted: Please take all your medications with you for your next visit with your Primary MD  Laboratory/radiological data: Please request your Primary MD to go over all hospital tests and procedure/radiological results at the follow up, please ask your Primary MD to get all Hospital records sent to his/her office.  In some cases, they will be blood work, cultures and biopsy results pending at the time of your discharge. Please request that your primary care M.D. follows up on these results.  Also Note the following: If you experience worsening of your admission symptoms, develop shortness of breath, life threatening emergency, suicidal or homicidal thoughts you must seek medical  attention immediately by calling 911 or calling your MD immediately  if symptoms less severe.  You must read complete instructions/literature along with all the possible adverse reactions/side effects for all the Medicines you take and that have been prescribed to you. Take any new Medicines after you have completely understood and accpet all the possible adverse reactions/side effects.   Do not drive when taking Pain medications or sleeping medications (Benzodaizepines)  Do not take more than prescribed Pain, Sleep and Anxiety Medications. It is not advisable to combine anxiety,sleep and pain medications without talking with your primary care practitioner  Special Instructions: If you have smoked or chewed Tobacco  in  the last 2 yrs please stop smoking, stop any regular Alcohol  and or any Recreational drug use.  Wear Seat belts while driving.  Please note: You were cared for by a hospitalist during your hospital stay. Once you are discharged, your primary care physician will handle any further medical issues. Please note that NO REFILLS for any discharge medications will be authorized once you are discharged, as it is imperative that you return to your primary care physician (or establish a relationship with a primary care physician if you do not have one) for your post hospital discharge needs so that they can reassess your need for medications and monitor your lab values.   3 weeks of isolation from 01/06/2019 (reported first positive Covid test)   Increase activity slowly   Complete by: As directed      Allergies as of 01/11/2019      Reactions   Atorvastatin Other (See Comments)   Canagliflozin Other (See Comments)   Note: tingling in legs/toes      Medication List    STOP taking these medications   glimepiride 4 MG tablet Commonly known as: AMARYL   losartan 50 MG tablet Commonly known as: COZAAR   methylPREDNISolone sodium succinate 125 mg/2 mL injection Commonly known as:  SOLU-MEDROL     TAKE these medications   albuterol 108 (90 Base) MCG/ACT inhaler Commonly known as: VENTOLIN HFA Inhale 2 puffs into the lungs every 4 (four) hours as needed for wheezing or shortness of breath.   amLODipine 5 MG tablet Commonly known as: NORVASC Take 1 tablet (5 mg total) by mouth daily.   ascorbic acid 500 MG tablet Commonly known as: VITAMIN C Take 1 tablet (500 mg total) by mouth daily.   aspirin EC 81 MG tablet Take 81 mg by mouth daily.   carvedilol 25 MG tablet Commonly known as: COREG Take 1 tablet (25 mg total) by mouth 2 (two) times daily with a meal. What changed:   how much to take  when to take this   cholecalciferol 25 MCG (1000 UT) tablet Commonly known as: VITAMIN D3 Take 1,000 Units by mouth daily.   dextromethorphan-guaiFENesin 30-600 MG 12hr tablet Commonly known as: MUCINEX DM Take 1 tablet by mouth 2 (two) times daily.   glipiZIDE 10 MG 24 hr tablet Commonly known as: GLUCOTROL XL Take 10 mg by mouth 2 (two) times daily.   ipratropium 17 MCG/ACT inhaler Commonly known as: ATROVENT HFA Inhale 2 puffs into the lungs every 4 (four) hours.   Lantus SoloStar 100 UNIT/ML Solostar Pen Generic drug: Insulin Glargine Inject 40 Units into the skin at bedtime. What changed: how much to take   NovoLOG FlexPen 100 UNIT/ML FlexPen Generic drug: insulin aspart 0-15 Units, Subcutaneous, 3 times daily with meals CBG < 70: implement hypoglycemia protocol-call MD CBG 70 - 120: 0 units CBG 121 - 150: 2 units CBG 151 - 200: 3 units CBG 201 - 250: 5 units CBG 251 - 300: 8 units CBG 301 - 350: 11 units CBG 351 - 400: 15 units CBG > 400:   Pacerone 200 MG tablet Generic drug: amiodarone Take 200 mg by mouth daily.   predniSONE 10 MG tablet Commonly known as: DELTASONE Take 40 mg daily for 1 day, 30 mg daily for 1 day, 20 mg daily for 1 days,10 mg daily for 1 day, then stop   Rivaroxaban 15 MG Tabs tablet Commonly known as: XARELTO Take 15 mg  by mouth daily.  rosuvastatin 40 MG tablet Commonly known as: CRESTOR Take 40 mg by mouth daily.   tadalafil 20 MG tablet Commonly known as: CIALIS Take 20 mg by mouth as needed.   torsemide 20 MG tablet Commonly known as: DEMADEX Hold due to AKI   zinc sulfate 220 (50 Zn) MG capsule Take 1 capsule (220 mg total) by mouth daily.            Durable Medical Equipment  (From admission, onward)         Start     Ordered   01/10/19 1731  DME Oxygen  (Discharge Planning)  Once    Question Answer Comment  Length of Need 6 Months   Mode or (Route) Mask   Liters per Minute 2   Frequency Continuous (stationary and portable oxygen unit needed)   Oxygen delivery system Gas      01/10/19 1730         Follow-up Information    Sherron Mondayejan-Sie, S Ahmed, MD. Schedule an appointment as soon as possible for a visit in 1 week(s).   Specialty: Internal Medicine Contact information: [address] [place] [address] [phone]          Allergies  Allergen Reactions  . Atorvastatin Other (See Comments)  . Canagliflozin Other (See Comments)    Note: tingling in legs/toes   Consultations:   None   Other Procedures/Studies: DG Chest Port 1 View  Result Date: 01/07/2019 CLINICAL DATA:  Shortness of breath, COVID-19 EXAM: PORTABLE CHEST 1 VIEW COMPARISON:  Portable exam 1222 hours compared to 03/05/2015 FINDINGS: Enlargement of cardiac silhouette post MVR. Prominent mediastinum accentuated by portable technique. Patchy BILATERAL pulmonary infiltrates consistent with multifocal pneumonia and history of COVID-19. No pleural effusion or pneumothorax. Bones unremarkable. IMPRESSION: Enlargement of cardiac silhouette post MVR. Patchy BILATERAL pulmonary infiltrates consistent with multifocal pneumonia and history of COVID-19. Electronically Signed   By: Ulyses SouthwardMark  Boles M.D.   On: 01/07/2019 12:52      TODAY-DAY OF DISCHARGE:  Subjective:   Caprice KluverLarry Provencal today has no headache,no  chest abdominal pain,no new weakness tingling or numbness, feels much better wants to go home today.   Objective:   Blood pressure (!) 155/120, pulse (!) 102, temperature (!) 97.3 F (36.3 C), temperature source Oral, resp. rate 18, height 5' 7.01" (1.702 m), weight 99.8 kg, SpO2 95 %. No intake or output data in the 24 hours ending 01/11/19 1255 Filed Weights   01/09/19 1412  Weight: 99.8 kg    Exam: Awake Alert, Oriented *3, No new F.N deficits, Normal affect Hardy.AT,PERRAL Supple Neck,No JVD, No cervical lymphadenopathy appriciated.  Symmetrical Chest wall movement, Good air movement bilaterally, CTAB RRR,No Gallops,Rubs or new Murmurs, No Parasternal Heave +ve B.Sounds, Abd Soft, Non tender, No organomegaly appriciated, No rebound -guarding or rigidity. No Cyanosis, Clubbing or edema, No new Rash or bruise   PERTINENT RADIOLOGIC STUDIES: DG Chest Port 1 View  Result Date: 01/07/2019 CLINICAL DATA:  Shortness of breath, COVID-19 EXAM: PORTABLE CHEST 1 VIEW COMPARISON:  Portable exam 1222 hours compared to 03/05/2015 FINDINGS: Enlargement of cardiac silhouette post MVR. Prominent mediastinum accentuated by portable technique. Patchy BILATERAL pulmonary infiltrates consistent with multifocal pneumonia and history of COVID-19. No pleural effusion or pneumothorax. Bones unremarkable. IMPRESSION: Enlargement of cardiac silhouette post MVR. Patchy BILATERAL pulmonary infiltrates consistent with multifocal pneumonia and history of COVID-19. Electronically Signed   By: Ulyses SouthwardMark  Boles M.D.   On: 01/07/2019 12:52     PERTINENT LAB RESULTS: CBC: Recent  Labs    01/10/19 0031 01/11/19 0318  WBC 9.9 9.5  HGB 13.2 13.6  HCT 41.8 42.9  PLT 151 153   CMET CMP     Component Value Date/Time   NA 142 01/11/2019 0318   NA 135 (L) 11/19/2013 1127   K 3.5 01/11/2019 0318   K 4.7 11/19/2013 1127   CL 99 01/11/2019 0318   CL 99 11/19/2013 1127   CO2 32 01/11/2019 0318   CO2 30 11/19/2013 1127    GLUCOSE 195 (H) 01/11/2019 0318   GLUCOSE 211 (H) 11/19/2013 1127   BUN 28 (H) 01/11/2019 0318   BUN 20 (H) 11/19/2013 1127   CREATININE 1.48 (H) 01/11/2019 0318   CREATININE 1.50 (H) 11/19/2013 1127   CALCIUM 8.7 (L) 01/11/2019 0318   CALCIUM 9.8 11/19/2013 1127   PROT 6.8 01/11/2019 0318   ALBUMIN 3.8 01/11/2019 0318   AST 45 (H) 01/11/2019 0318   ALT 90 (H) 01/11/2019 0318   ALKPHOS 134 (H) 01/11/2019 0318   BILITOT 1.0 01/11/2019 0318   GFRNONAA 50 (L) 01/11/2019 0318   GFRNONAA 51 (L) 11/19/2013 1127   GFRAA 58 (L) 01/11/2019 0318   GFRAA >60 11/19/2013 1127    GFR Estimated Creatinine Clearance: 57.5 mL/min (A) (by C-G formula based on SCr of 1.48 mg/dL (H)). No results for input(s): LIPASE, AMYLASE in the last 72 hours. No results for input(s): CKTOTAL, CKMB, CKMBINDEX, TROPONINI in the last 72 hours. Invalid input(s): POCBNP Recent Labs    01/10/19 0031 01/11/19 0318  DDIMER 2.33* 2.24*   Recent Labs    01/08/19 2105  HGBA1C 11.5*   No results for input(s): CHOL, HDL, LDLCALC, TRIG, CHOLHDL, LDLDIRECT in the last 72 hours. No results for input(s): TSH, T4TOTAL, T3FREE, THYROIDAB in the last 72 hours.  Invalid input(s): FREET3 Recent Labs    01/10/19 0031 01/11/19 0318  FERRITIN 440* 364*   Coags: No results for input(s): INR in the last 72 hours.  Invalid input(s): PT Microbiology: Recent Results (from the past 240 hour(s))  Culture, blood (Routine X 2) w Reflex to ID Panel     Status: None (Preliminary result)   Collection Time: 01/07/19  8:02 PM   Specimen: BLOOD  Result Value Ref Range Status   Specimen Description BLOOD BLOOD LEFT HAND  Final   Special Requests   Final    BOTTLES DRAWN AEROBIC AND ANAEROBIC Blood Culture adequate volume   Culture   Final    NO GROWTH 4 DAYS Performed at [facility], [address], [place], [place] [address]    Report Status PENDING  Incomplete  Culture, blood (Routine X 2) w Reflex to ID Panel      Status: None (Preliminary result)   Collection Time: 01/07/19  8:02 PM   Specimen: BLOOD  Result Value Ref Range Status   Specimen Description BLOOD LEFT ARM  Final   Special Requests   Final    BOTTLES DRAWN AEROBIC ONLY Blood Culture results may not be optimal due to an inadequate volume of blood received in culture bottles   Culture   Final    NO GROWTH 4 DAYS Performed at [facility], [address], [place], [place] [address]    Report Status PENDING  Incomplete  SARS CORONAVIRUS 2 (TAT 6-24 HRS) Nasopharyngeal Nasopharyngeal Swab     Status: Abnormal   Collection Time: 01/08/19 10:21 PM   Specimen: Nasopharyngeal Swab  Result Value Ref Range Status   SARS Coronavirus 2  POSITIVE (A) NEGATIVE Final    Comment: RESULT CALLED TO, READ BACK BY AND VERIFIED WITH: Wyvonnia Dusky RN 10:25 01/09/19 ([email or web address]) (NOTE) SARS-CoV-2 target nucleic acids are DETECTED. The SARS-CoV-2 RNA is generally detectable in upper and lower respiratory specimens during the acute phase of infection. Positive results are indicative of the presence of SARS-CoV-2 RNA. Clinical correlation with patient history and other diagnostic information is  necessary to determine patient infection status. Positive results do not rule out bacterial infection or co-infection with other viruses.  The expected result is Negative. Fact Sheet for Patients: HairSlick.no Fact Sheet for Healthcare Providers: quierodirigir.com This test is not yet approved or cleared by the [place] FDA and  has been authorized for detection and/or diagnosis of SARS-CoV-2 by FDA under an Emergency Use Authorization (EUA). This EUA will remain  in effect (meaning this test can be used) for the d uration of the COVID-19 declaration under Section 564(b)(1) of the Act, 21 U.S.C. section 360bbb-3(b)(1), unless the authorization is terminated or revoked sooner. Performed at [facility], [address], [place], [place] [address]     FURTHER DISCHARGE INSTRUCTIONS:  Get Medicines reviewed and adjusted: Please take all your medications with you for your next visit with your Primary MD  Laboratory/radiological data: Please request your Primary MD to go over all hospital tests and procedure/radiological results at the follow up, please ask your Primary MD to get all Hospital records sent to his/her office.  In some cases, they will be blood work, cultures and biopsy results pending at the time of your discharge. Please request that your primary care M.D. goes through all the records of your hospital data and follows up on these results.  Also Note the following: If you experience worsening of your admission symptoms, develop shortness of breath, life threatening emergency, suicidal or homicidal thoughts you must seek medical attention immediately by calling 911 or calling your MD immediately  if symptoms less severe.  You must read complete instructions/literature along with all the possible adverse reactions/side effects for all the Medicines you take and that have been prescribed to you. Take any new Medicines after you have completely understood and accpet all the possible adverse reactions/side effects.   Do not drive when taking Pain medications or sleeping medications (Benzodaizepines)  Do not take more than prescribed Pain, Sleep and Anxiety Medications. It is not advisable to combine anxiety,sleep and pain medications without talking with your primary care practitioner  Special Instructions: If you have smoked or chewed Tobacco  in the last 2 yrs please stop smoking, stop any regular Alcohol  and or any Recreational drug use.  Wear Seat belts while driving.  Please note: You were cared for by a hospitalist during your hospital stay. Once you are discharged, your primary care physician will handle any further medical issues. Please note that NO REFILLS for  any discharge medications will be authorized once you are discharged, as it is imperative that you return to your primary care physician (or establish a relationship with a primary care physician if you do not have one) for your post hospital discharge needs so that they can reassess your need for medications and monitor your lab values.  Total Time spent coordinating discharge including counseling, education and face to face time equals 35 minutes.  SignedJeoffrey Massed 01/11/2019 12:55 PM

## 2019-01-11 NOTE — Discharge Instructions (Signed)
Person Under Monitoring Name: [name]  Location: [address] [place] [address]   Infection Prevention Recommendations for Individuals Confirmed to have, or Being Evaluated for, 2019 Novel Coronavirus (COVID-19) Infection Who Receive Care at Home  Individuals who are confirmed to have, or are being evaluated for, COVID-19 should follow the prevention steps below until a healthcare provider or local or state health department says they can return to normal activities.  Stay home except to get medical care You should restrict activities outside your home, except for getting medical care. Do not go to work, school, or public areas, and do not use public transportation or taxis.  Call ahead before visiting your doctor Before your medical appointment, call the healthcare provider and tell them that you have, or are being evaluated for, COVID-19 infection. This will help the healthcare providers office take steps to keep other people from getting infected. Ask your healthcare provider to call the local or state health department.  Monitor your symptoms Seek prompt medical attention if your illness is worsening (e.g., difficulty breathing). Before going to your medical appointment, call the healthcare provider and tell them that you have, or are being evaluated for, COVID-19 infection. Ask your healthcare provider to call the local or state health department.  Wear a facemask You should wear a facemask that covers your nose and mouth when you are in the same room with other people and when you visit a healthcare provider. People who live with or visit you should also wear a facemask while they are in the same room with you.  Separate yourself from other people in your home As much as possible, you should stay in a different room from other people in your home. Also, you should use a separate bathroom, if available.  Avoid sharing household items You should not share dishes,  drinking glasses, cups, eating utensils, towels, bedding, or other items with other people in your home. After using these items, you should wash them thoroughly with soap and water.  Cover your coughs and sneezes Cover your mouth and nose with a tissue when you cough or sneeze, or you can cough or sneeze into your sleeve. Throw used tissues in a lined trash can, and immediately wash your hands with soap and water for at least 20 seconds or use an alcohol-based hand rub.  Wash your [employer] your hands often and thoroughly with soap and water for at least 20 seconds. You can use an alcohol-based hand sanitizer if soap and water are not available and if your hands are not visibly dirty. Avoid touching your eyes, nose, and mouth with unwashed hands.   Prevention Steps for Caregivers and Household Members of Individuals Confirmed to have, or Being Evaluated for, COVID-19 Infection Being Cared for in the Home  If you live with, or provide care at home for, a person confirmed to have, or being evaluated for, COVID-19 infection please follow these guidelines to prevent infection:  Follow healthcare providers instructions Make sure that you understand and can help the patient follow any healthcare provider instructions for all care.  Provide for the patients basic needs You should help the patient with basic needs in the home and provide support for getting groceries, prescriptions, and other personal needs.  Monitor the patients symptoms If they are getting sicker, call his or her medical provider and tell them that the patient has, or is being evaluated for, COVID-19 infection. This will help the healthcare providers office  take steps to keep other people from getting infected. Ask the healthcare provider to call the local or state health department.  Limit the number of people who have contact with the patient  If possible, have only one caregiver for the patient.  Other  household members should stay in another home or place of residence. If this is not possible, they should stay  in another room, or be separated from the patient as much as possible. Use a separate bathroom, if available.  Restrict visitors who do not have an essential need to be in the home.  Keep older adults, very young children, and other sick people away from the patient Keep older adults, very young children, and those who have compromised immune systems or chronic health conditions away from the patient. This includes people with chronic heart, lung, or kidney conditions, diabetes, and cancer.  Ensure good ventilation Make sure that shared spaces in the home have good air flow, such as from an air conditioner or an opened window, weather permitting.  Wash your hands often  Wash your hands often and thoroughly with soap and water for at least 20 seconds. You can use an alcohol based hand sanitizer if soap and water are not available and if your hands are not visibly dirty.  Avoid touching your eyes, nose, and mouth with unwashed hands.  Use disposable paper towels to dry your hands. If not available, use dedicated cloth towels and replace them when they become wet.  Wear a facemask and gloves  Wear a disposable facemask at all times in the room and gloves when you touch or have contact with the patients blood, body fluids, and/or secretions or excretions, such as sweat, saliva, sputum, nasal mucus, vomit, urine, or feces.  Ensure the mask fits over your nose and mouth tightly, and do not touch it during use.  Throw out disposable facemasks and gloves after using them. Do not reuse.  Wash your hands immediately after removing your facemask and gloves.  If your personal clothing becomes contaminated, carefully remove clothing and launder. Wash your hands after handling contaminated clothing.  Place all used disposable facemasks, gloves, and other waste in a lined container before  disposing them with other household waste.  Remove gloves and wash your hands immediately after handling these items.  Do not share dishes, glasses, or other household items with the patient  Avoid sharing household items. You should not share dishes, drinking glasses, cups, eating utensils, towels, bedding, or other items with a patient who is confirmed to have, or being evaluated for, COVID-19 infection.  After the person uses these items, you should wash them thoroughly with soap and water.  Wash laundry thoroughly  Immediately remove and wash clothes or bedding that have blood, body fluids, and/or secretions or excretions, such as sweat, saliva, sputum, nasal mucus, vomit, urine, or feces, on them.  Wear gloves when handling laundry from the patient.  Read and follow directions on labels of laundry or clothing items and detergent. In general, wash and dry with the warmest temperatures recommended on the label.  Clean all areas the individual has used often  Clean all touchable surfaces, such as counters, tabletops, doorknobs, bathroom fixtures, toilets, phones, keyboards, tablets, and bedside tables, every day. Also, clean any surfaces that may have blood, body fluids, and/or secretions or excretions on them.  Wear gloves when cleaning surfaces the patient has come in contact with.  Use a diluted bleach solution (e.g., dilute bleach with 1 part  bleach and 10 parts water) or a household disinfectant with a label that says EPA-registered for coronaviruses. To make a bleach solution at home, add 1 tablespoon of bleach to 1 quart (4 cups) of water. For a larger supply, add  cup of bleach to 1 gallon (16 cups) of water.  Read labels of cleaning products and follow recommendations provided on product labels. Labels contain instructions for safe and effective use of the cleaning product including precautions you should take when applying the product, such as wearing gloves or eye protection  and making sure you have good ventilation during use of the product.  Remove gloves and wash hands immediately after cleaning.  Monitor yourself for signs and symptoms of illness Caregivers and household members are considered close contacts, should monitor their health, and will be asked to limit movement outside of the home to the extent possible. Follow the monitoring steps for close contacts listed on the symptom monitoring form.   ? If you have additional questions, contact your local health department or call the epidemiologist on call at [phone] (available 24/7). ? This guidance is subject to change. For the most up-to-date guidance from [facility], please refer to their website: [email or web address]

## 2019-01-12 LAB — CULTURE, BLOOD (ROUTINE X 2)
Culture: NO GROWTH
Culture: NO GROWTH
Special Requests: ADEQUATE

## 2019-02-03 DEATH — deceased

## 2020-03-27 IMAGING — DX DG CHEST 1V PORT
1 series · 1 of 1 positions shown · non-contrast
Comparison: Portable exam 6888 hours compared to 03/05/2015

CLINICAL DATA: Shortness of breath, FDUU5-QH

EXAM:
PORTABLE CHEST 1 VIEW

[chest ap]
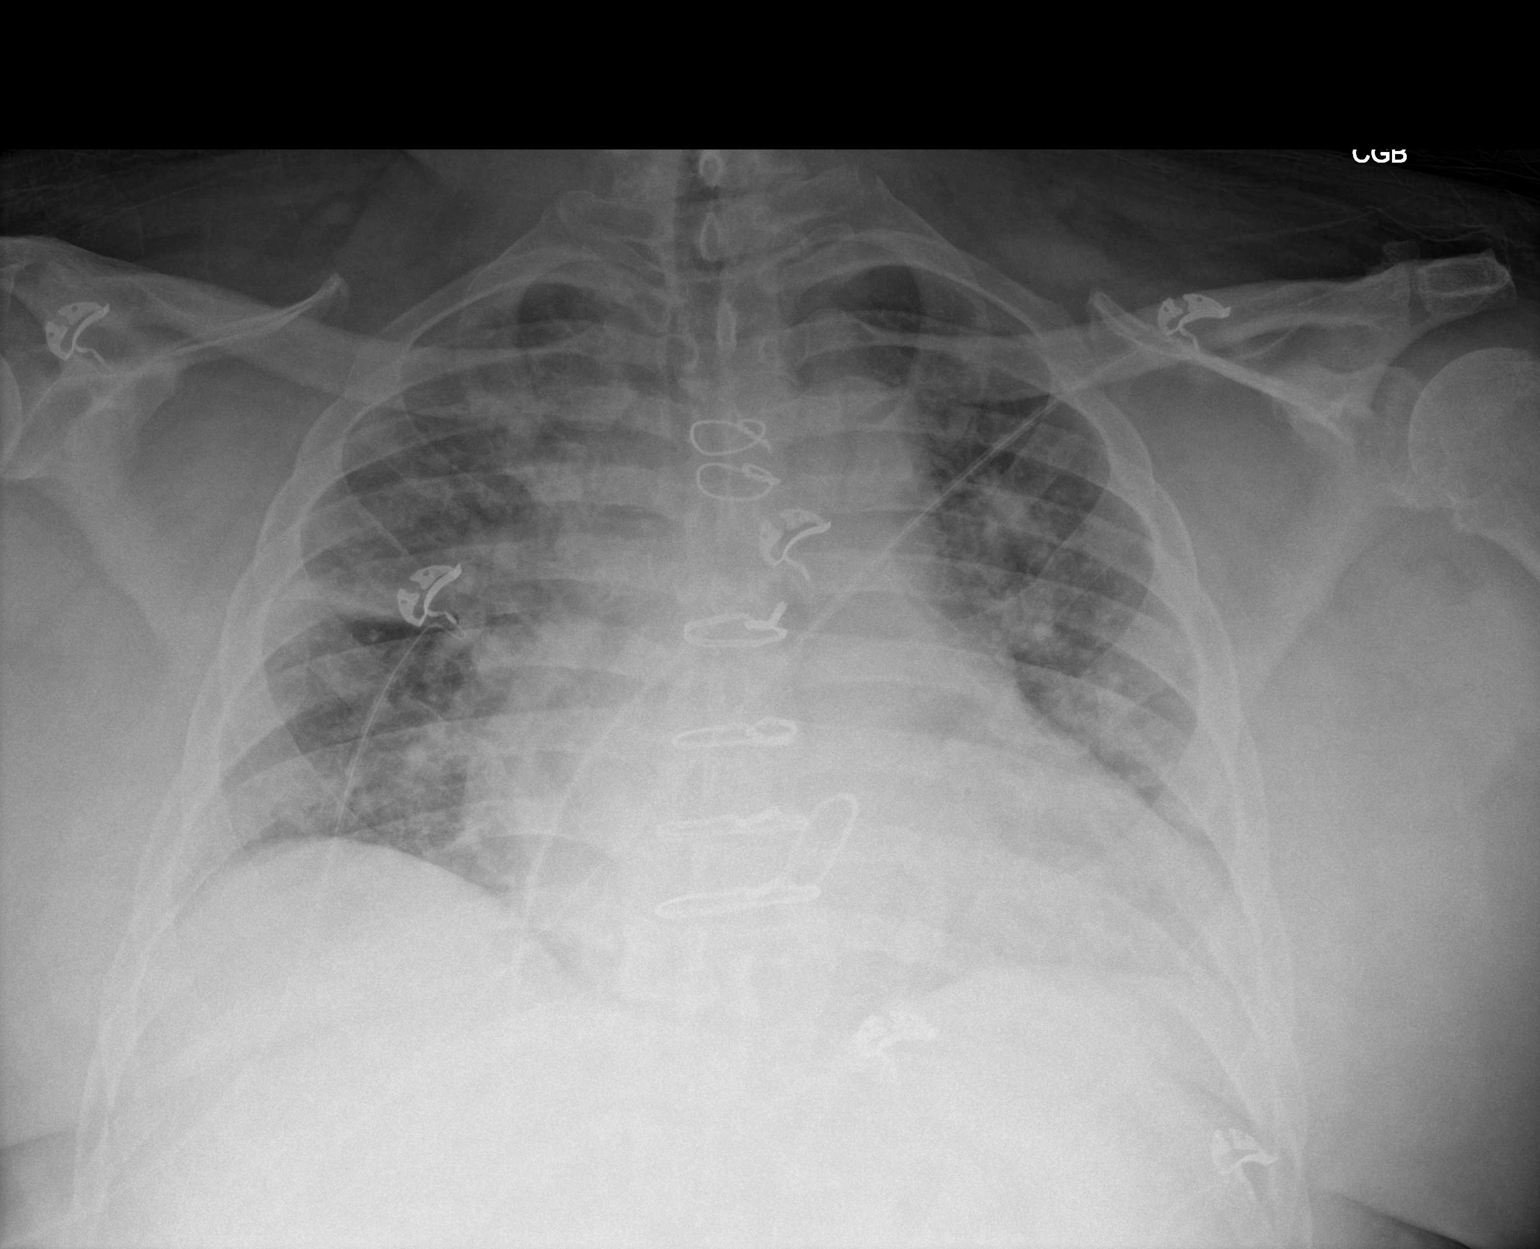

[1 of 1 positions shown; findings below may reference images not displayed]

FINDINGS: Enlargement of cardiac silhouette post MVR.

Prominent mediastinum accentuated by portable technique.

Patchy BILATERAL pulmonary infiltrates consistent with multifocal
pneumonia and history of FDUU5-QH.

No pleural effusion or pneumothorax.

Bones unremarkable.
IMPRESSION: Enlargement of cardiac silhouette post MVR.

Patchy BILATERAL pulmonary infiltrates consistent with multifocal
pneumonia and history of FDUU5-QH.
